# Patient Record
Sex: Female | Born: 1995
Health system: Southern US, Community
[De-identification: ages and names within clinical notes are randomized; demographics above are authoritative.]

## PROBLEM LIST (undated history)

## (undated) DIAGNOSIS — F32A Depression, unspecified: Secondary | ICD-10-CM

## (undated) DIAGNOSIS — F419 Anxiety disorder, unspecified: Secondary | ICD-10-CM

## (undated) DIAGNOSIS — F319 Bipolar disorder, unspecified: Secondary | ICD-10-CM

## (undated) DIAGNOSIS — K219 Gastro-esophageal reflux disease without esophagitis: Secondary | ICD-10-CM

## (undated) DIAGNOSIS — F329 Major depressive disorder, single episode, unspecified: Secondary | ICD-10-CM

## (undated) DIAGNOSIS — T7840XA Allergy, unspecified, initial encounter: Secondary | ICD-10-CM

## (undated) DIAGNOSIS — D241 Benign neoplasm of right breast: Secondary | ICD-10-CM

## (undated) DIAGNOSIS — K589 Irritable bowel syndrome without diarrhea: Secondary | ICD-10-CM

## (undated) HISTORY — DX: Anxiety disorder, unspecified: F41.9

## (undated) HISTORY — DX: Major depressive disorder, single episode, unspecified: F32.9

## (undated) HISTORY — DX: Irritable bowel syndrome, unspecified: K58.9

## (undated) HISTORY — DX: Bipolar disorder, unspecified: F31.9

## (undated) HISTORY — DX: Gastro-esophageal reflux disease without esophagitis: K21.9

## (undated) HISTORY — PX: TONSILLECTOMY: SUR1361

## (undated) HISTORY — DX: Depression, unspecified: F32.A

## (undated) HISTORY — DX: Allergy, unspecified, initial encounter: T78.40XA

## (undated) HISTORY — DX: Benign neoplasm of right breast: D24.1

---
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
## 2014-09-13 ENCOUNTER — Other Ambulatory Visit: Payer: Self-pay | Admitting: Family Medicine

## 2014-09-13 DIAGNOSIS — R1084 Generalized abdominal pain: Secondary | ICD-10-CM

## 2014-09-13 DIAGNOSIS — N83209 Unspecified ovarian cyst, unspecified side: Secondary | ICD-10-CM

## 2014-09-14 ENCOUNTER — Encounter: Payer: Self-pay | Admitting: Obstetrics & Gynecology

## 2014-09-14 ENCOUNTER — Ambulatory Visit (INDEPENDENT_AMBULATORY_CARE_PROVIDER_SITE_OTHER): Payer: 59 | Admitting: Obstetrics & Gynecology

## 2014-09-14 VITALS — BP 136/87 | HR 62 | Wt 171.0 lb

## 2014-09-14 DIAGNOSIS — N83209 Unspecified ovarian cyst, unspecified side: Secondary | ICD-10-CM | POA: Insufficient documentation

## 2014-09-14 DIAGNOSIS — N8320 Unspecified ovarian cysts: Secondary | ICD-10-CM | POA: Diagnosis not present

## 2014-09-14 DIAGNOSIS — R1084 Generalized abdominal pain: Secondary | ICD-10-CM | POA: Diagnosis not present

## 2014-09-14 DIAGNOSIS — N83202 Unspecified ovarian cyst, left side: Secondary | ICD-10-CM

## 2014-09-14 MED ORDER — NORETHINDRONE ACET-ETHINYL EST 1.5-30 MG-MCG PO TABS: 1.0000 | ORAL_TABLET | Freq: Every day | ORAL | Status: DC

## 2014-09-14 NOTE — Patient Instructions (Signed)
Ovarian Cyst An ovarian cyst is a fluid-filled sac that forms on an ovary. The ovaries are small organs that produce eggs in women. Various types of cysts can form on the ovaries. Most are not cancerous. Many do not cause problems, and they often go away on their own. Some may cause symptoms and require treatment. Common types of ovarian cysts include:  Functional cysts--These cysts may occur every month during the menstrual cycle. This is normal. The cysts usually go away with the next menstrual cycle if the woman does not get pregnant. Usually, there are no symptoms with a functional cyst.  Endometrioma cysts--These cysts form from the tissue that lines the uterus. They are also called "chocolate cysts" because they become filled with blood that turns brown. This type of cyst can cause pain in the lower abdomen during intercourse and with your menstrual period.  Cystadenoma cysts--This type develops from the cells on the outside of the ovary. These cysts can get very big and cause lower abdomen pain and pain with intercourse. This type of cyst can twist on itself, cut off its blood supply, and cause severe pain. It can also easily rupture and cause a lot of pain.  Dermoid cysts--This type of cyst is sometimes found in both ovaries. These cysts may contain different kinds of body tissue, such as skin, teeth, hair, or cartilage. They usually do not cause symptoms unless they get very big.  Theca lutein cysts--These cysts occur when too much of a certain hormone (human chorionic gonadotropin) is produced and overstimulates the ovaries to produce an egg. This is most common after procedures used to assist with the conception of a baby (in vitro fertilization). CAUSES   Fertility drugs can cause a condition in which multiple large cysts are formed on the ovaries. This is called ovarian hyperstimulation syndrome.  A condition called polycystic ovary syndrome can cause hormonal imbalances that can lead to  nonfunctional ovarian cysts. SIGNS AND SYMPTOMS  Many ovarian cysts do not cause symptoms. If symptoms are present, they may include:  Pelvic pain or pressure.  Pain in the lower abdomen.  Pain during sexual intercourse.  Increasing girth (swelling) of the abdomen.  Abnormal menstrual periods.  Increasing pain with menstrual periods.  Stopping having menstrual periods without being pregnant. DIAGNOSIS  These cysts are commonly found during a routine or annual pelvic exam. Tests may be ordered to find out more about the cyst. These tests may include:  Ultrasound.  X-ray of the pelvis.  CT scan.  MRI.  Blood tests. TREATMENT  Many ovarian cysts go away on their own without treatment. Your health care provider may want to check your cyst regularly for 2-3 months to see if it changes. For women in menopause, it is particularly important to monitor a cyst closely because of the higher rate of ovarian cancer in menopausal women. When treatment is needed, it may include any of the following:  A procedure to drain the cyst (aspiration). This may be done using a long needle and ultrasound. It can also be done through a laparoscopic procedure. This involves using a thin, lighted tube with a tiny camera on the end (laparoscope) inserted through a small incision.  Surgery to remove the whole cyst. This may be done using laparoscopic surgery or an open surgery involving a larger incision in the lower abdomen.  Hormone treatment or birth control pills. These methods are sometimes used to help dissolve a cyst. HOME CARE INSTRUCTIONS   Only take over-the-counter   or prescription medicines as directed by your health care provider.  Follow up with your health care provider as directed.  Get regular pelvic exams and Pap tests. SEEK MEDICAL CARE IF:   Your periods are late, irregular, or painful, or they stop.  Your pelvic pain or abdominal pain does not go away.  Your abdomen becomes  larger or swollen.  You have pressure on your bladder or trouble emptying your bladder completely.  You have pain during sexual intercourse.  You have feelings of fullness, pressure, or discomfort in your stomach.  You lose weight for no apparent reason.  You feel generally ill.  You become constipated.  You lose your appetite.  You develop acne.  You have an increase in body and facial hair.  You are gaining weight, without changing your exercise and eating habits.  You think you are pregnant. SEEK IMMEDIATE MEDICAL CARE IF:   You have increasing abdominal pain.  You feel sick to your stomach (nauseous), and you throw up (vomit).  You develop a fever that comes on suddenly.  You have abdominal pain during a bowel movement.  Your menstrual periods become heavier than usual. MAKE SURE YOU:  Understand these instructions.  Will watch your condition.  Will get help right away if you are not doing well or get worse. Document Released: 04/22/2005 Document Revised: 04/27/2013 Document Reviewed: 12/28/2012 Exit[employer] Patient Information 2015 [employer]. This information is not intended to replace advice given to you by your health care provider. Make sure you discuss any questions you have with your health care provider.  

## 2014-09-14 NOTE — Progress Notes (Signed)
   CLINIC ENCOUNTER NOTE  History:  19 y.o. G0P0000 here today for evaluation of left sided pelvic pain for 3 months.  Had an ultrasound in [place] in 3/20016 which showed a physiologic left ovarian cyst.  Reports pain has been episodic since then, and worsened over the past week.  Pain is moderate to severe in intensity and located in left pelvic area/lower abdomen, occasionally radiates to left hip area.  Helped a little with Ibuprofen.  Episodes last a few minutes - hours.  No associated fevers, nausea, vomiting, urinary or gastrointestinal symptoms.  Of note, patient was seen on 09/13/14 by her PCP at [facility] (Dr. Maurice Small, MD) who ordered a pelvic ultrasound for evaluation of this pain, this study has not been scheduled.  Past Medical History  Diagnosis Date  . Anxiety     Past Surgical History  Procedure Laterality Date  . Leg surgery Left 2014    The following portions of the patient's history were reviewed and updated as appropriate: allergies, current medications, past family history, past medical history, past social history, past surgical history and problem list.   Health Maintenance:  Received Gardasil series. On OCPs (Loestrin 1/20 for contraception).  Review of Systems:  Pertinent items are noted in HPI. Comprehensive review of systems was otherwise negative.  Objective:  Physical Exam BP 136/87 mmHg  Pulse 62  Wt 171 lb (77.565 kg)  LMP 09/01/2014 CONSTITUTIONAL: Well-developed, well-nourished female in no acute distress.  HENT:  Normocephalic, atraumatic, External right and left ear normal. Oropharynx is clear and moist EYES: Conjunctivae and EOM are normal. Pupils are equal, round, and reactive to light. No scleral icterus.  NECK: Normal range of motion, supple, no masses SKIN: Skin is warm and dry. No rash noted. Not diaphoretic. No erythema. No pallor. [place]: Alert and oriented to person, place, and time. Normal reflexes, muscle tone coordination. No  cranial nerve deficit noted. PSYCHIATRIC: Normal mood and affect. Normal behavior. Normal judgment and thought content. CARDIOVASCULAR: Normal heart rate noted, regular rhythm RESPIRATORY: Effort and breath sounds normal, no problems with respiration noted ABDOMEN: Soft, no distention noted.  Mild LLQ tenderness, no rebound or guarding.  PELVIC: Normal appearing external genitalia; normal appearing vaginal mucosa and cervix.  Normal appearing discharge.  Normal uterine size, no other palpable masses, no uterine tenderness. Left adnexal fullness and tenderness appreciated, normal right adnexa. MUSCULOSKELETAL: Normal range of motion. No edema and no tenderness.   Assessment & Plan:  Pelvic ultrasound scheduled; will follow up results and manage accordingly. Discussed OCPs and its role in ovarian cyst suppression. Changed Loestrin from 1/20 formulation to 1.5/30 formulation (this was prescribed for patient) to help with ovulation suppression, will monitor response. Patient will call for any worsening symptoms; pain/torsion precautions reviewed Routine preventative health maintenance measures emphasized.   Verita Schneiders, MD, [facility] [place] for [employer], [place]

## 2014-09-15 ENCOUNTER — Ambulatory Visit
Admission: RE | Admit: 2014-09-15 | Discharge: 2014-09-15 | Disposition: A | Discharge status: Home / Self Care | Payer: 59 | Source: Ambulatory Visit | Attending: Family Medicine | Admitting: Family Medicine

## 2014-09-22 ENCOUNTER — Telehealth: Payer: Self-pay | Admitting: *Deleted

## 2014-09-22 NOTE — Telephone Encounter (Signed)
Pain can be from GI, musculoskeletal or other etiology.  Can be seen by PCP for further evaluation.  No GYN etiology seen at this point.

## 2014-09-22 NOTE — Telephone Encounter (Signed)
Patient called for test results.  Advised her of ultrasound results.  She is continuing to have pain and has had pain since February.  Some days the pain is much worse than others but it is constantly there.  She wants to know what her options are to figure out what could be causing her discomfort.

## 2014-09-29 NOTE — Telephone Encounter (Signed)
Advised patient of recommendations.  

## 2014-09-30 ENCOUNTER — Encounter (HOSPITAL_COMMUNITY): Payer: Self-pay | Admitting: *Deleted

## 2014-09-30 ENCOUNTER — Emergency Department (HOSPITAL_COMMUNITY)
Admission: EM | Admit: 2014-09-30 | Discharge: 2014-09-30 | Disposition: A | Discharge status: Home / Self Care | Payer: 59 | Source: Home / Self Care

## 2014-09-30 DIAGNOSIS — L723 Sebaceous cyst: Secondary | ICD-10-CM

## 2014-09-30 MED ORDER — NEOMYCIN-POLYMYXIN-HC 3.5-10000-1 OT SUSP: 4.0000 [drp] | Freq: Three times a day (TID) | OTIC | Status: DC

## 2014-09-30 MED ORDER — CEPHALEXIN 500 MG PO CAPS
500.0000 mg | ORAL_CAPSULE | Freq: Four times a day (QID) | ORAL | Status: DC
Start: 2014-09-30 — End: 2018-03-10

## 2014-09-30 NOTE — ED Provider Notes (Signed)
CSN: [id number]     Arrival date & time 09/30/14  1120 History   None    Chief Complaint  Patient presents with  . Ear Problem   (Consider location/radiation/quality/duration/timing/severity/associated sxs/prior Treatment) Patient is a 19 y.o. female presenting with ear pain. The history is provided by the patient.  Otalgia Location:  Right Behind ear:  No abnormality Quality:  Dull Severity:  Mild Onset quality:  Gradual Duration:  4 days Progression:  Worsening Chronicity:  New Context comment:  H/o cysts x 2 in left canal , feels similar now on right side. Relieved by:  None tried Worsened by:  Nothing tried Associated symptoms: no ear discharge     Past Medical History  Diagnosis Date  . Anxiety    Past Surgical History  Procedure Laterality Date  . Leg surgery Left 2014   Family History  Problem Relation Age of Onset  . Diabetes Maternal Grandmother    History  Substance Use Topics  . Smoking status: Never Smoker   . Smokeless tobacco: Never Used  . Alcohol Use: No   OB History    Gravida Para Term Preterm AB TAB SAB Ectopic Multiple Living   0 0 0 0 0 0 0 0 0 0      Review of Systems  Constitutional: Negative.   HENT: Positive for ear pain. Negative for ear discharge.     Allergies  Review of patient's allergies indicates no known allergies.  Home Medications   Prior to Admission medications   Medication Sig Start Date End Date Taking? Authorizing Provider  busPIRone (BUSPAR) 10 MG tablet Take 10 mg by mouth 3 (three) times daily.    Historical Provider, MD  cephALEXin (KEFLEX) 500 MG capsule Take 1 capsule (500 mg total) by mouth 4 (four) times daily. Take all of medicine and drink lots of fluids 09/30/14   Billy Fischer, MD  neomycin-polymyxin-hydrocortisone (CORTISPORIN) 3.5-10000-1 otic suspension Place 4 drops into the right ear 3 (three) times daily. 09/30/14   Billy Fischer, MD  Norethindrone Acetate-Ethinyl Estradiol  (JUNEL,LOESTRIN,MICROGESTIN) 1.5-30 MG-MCG tablet Take 1 tablet by mouth daily. 09/14/14   Sallyanne Havers Anyanwu, MD   BP 125/78 mmHg  Pulse 56  Temp(Src) 98.2 F (36.8 C) (Oral)  Resp 18  SpO2 100%  LMP 09/19/2014 Physical Exam  Constitutional: She is oriented to person, place, and time. She appears well-developed and well-nourished. No distress.  HENT:  Head: Normocephalic.  Right Ear: There is swelling and tenderness. No drainage. No decreased hearing is noted.  Left Ear: External ear normal.  Ears:  Mouth/Throat: Oropharynx is clear and moist.  Eyes: Conjunctivae are normal. Pupils are equal, round, and reactive to light.  Neck: Normal range of motion. Neck supple.  Lymphadenopathy:    She has no cervical adenopathy.  Neurological: She is alert and oriented to person, place, and time.  Skin: Skin is warm and dry.  Nursing note and vitals reviewed.   ED Course  Procedures (including critical care time) Labs Review Labs Reviewed - No data to display  Imaging Review No results found.   MDM   1. Sebaceous cyst of ear        Billy Fischer, MD 09/30/14 [phone]

## 2014-09-30 NOTE — ED Notes (Signed)
Pt  Reports  A  Boil  Inside       r   Ear   X  3  Days          Pt  Reports  Has  Had  Similar episode  l  Ear  In  Past      she  denys  Any other  Symptoms

## 2014-09-30 NOTE — Discharge Instructions (Signed)
Warm soak before drops, see dr Constance Holster if further problems.

## 2014-11-23 ENCOUNTER — Encounter: Payer: Self-pay | Admitting: *Deleted

## 2015-05-09 DIAGNOSIS — G43B0 Ophthalmoplegic migraine, not intractable: Secondary | ICD-10-CM | POA: Diagnosis not present

## 2015-05-17 DIAGNOSIS — F411 Generalized anxiety disorder: Secondary | ICD-10-CM | POA: Diagnosis not present

## 2015-06-05 DIAGNOSIS — J039 Acute tonsillitis, unspecified: Secondary | ICD-10-CM | POA: Diagnosis not present

## 2015-06-12 DIAGNOSIS — H6121 Impacted cerumen, right ear: Secondary | ICD-10-CM | POA: Diagnosis not present

## 2015-06-21 DIAGNOSIS — F411 Generalized anxiety disorder: Secondary | ICD-10-CM | POA: Diagnosis not present

## 2015-09-12 DIAGNOSIS — L299 Pruritus, unspecified: Secondary | ICD-10-CM | POA: Diagnosis not present

## 2015-09-12 DIAGNOSIS — H938X1 Other specified disorders of right ear: Secondary | ICD-10-CM | POA: Diagnosis not present

## 2015-09-14 DIAGNOSIS — M25562 Pain in left knee: Secondary | ICD-10-CM | POA: Diagnosis not present

## 2015-09-14 DIAGNOSIS — M25561 Pain in right knee: Secondary | ICD-10-CM | POA: Diagnosis not present

## 2015-09-19 IMAGING — US US PELVIS COMPLETE
1 series · 14 of 25 positions shown · non-contrast
Comparison: None

CLINICAL DATA: Left lower quadrant pain since [REDACTED]



[Series 1: us pelvis complete · 0.30mm/px · 14 of 55 slices shown]
[im 1/55]
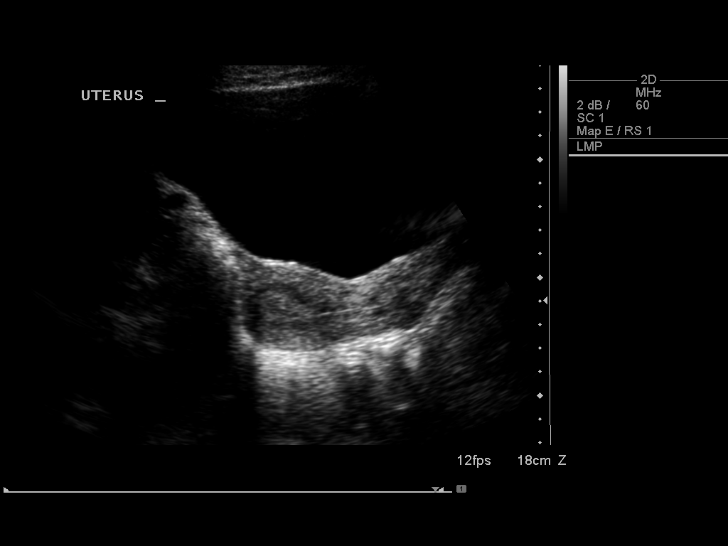
[im 5/55]
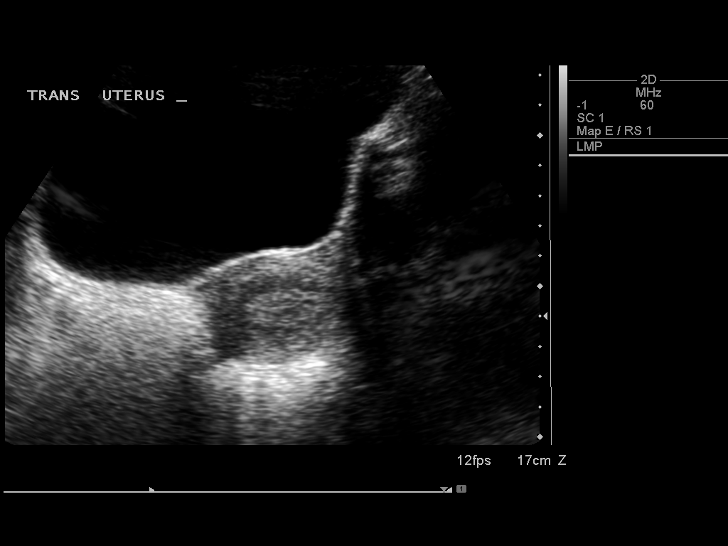
[im 10/55]
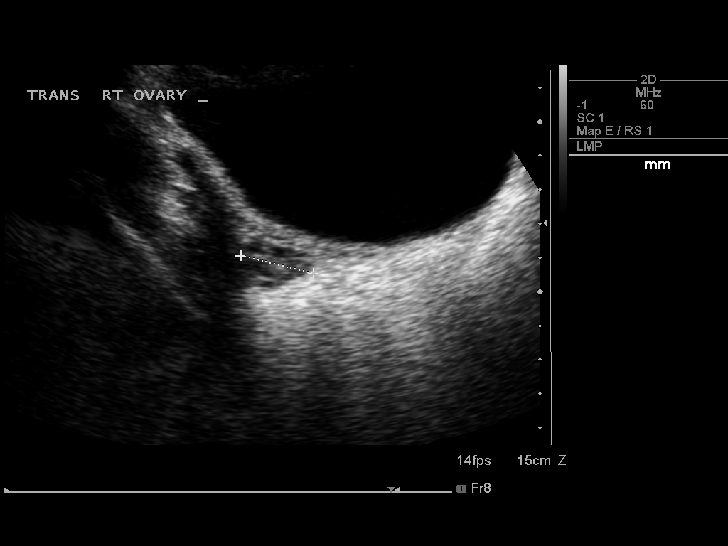
[im 14/55]
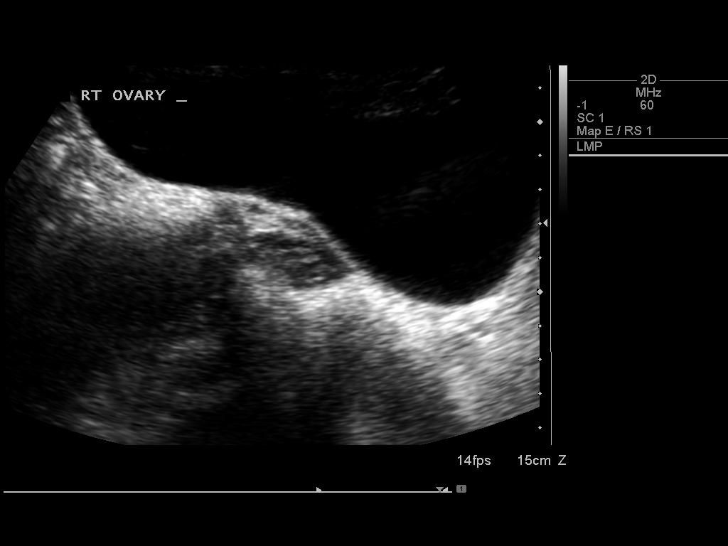
[im 19/55]
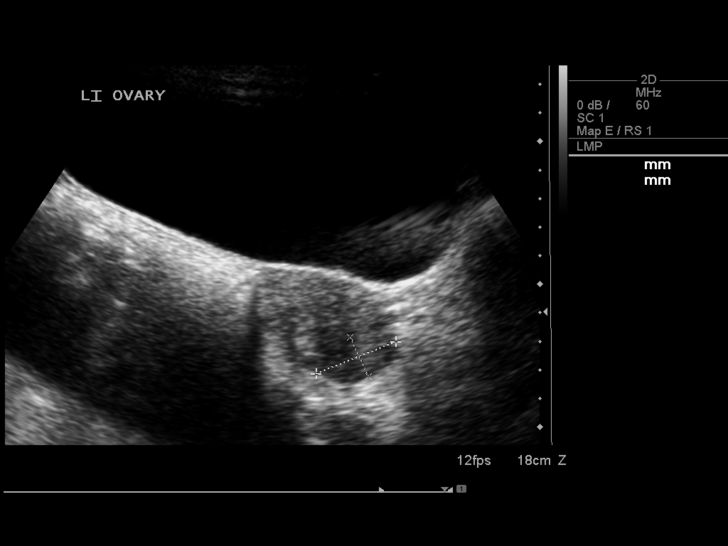
[im 21/55]
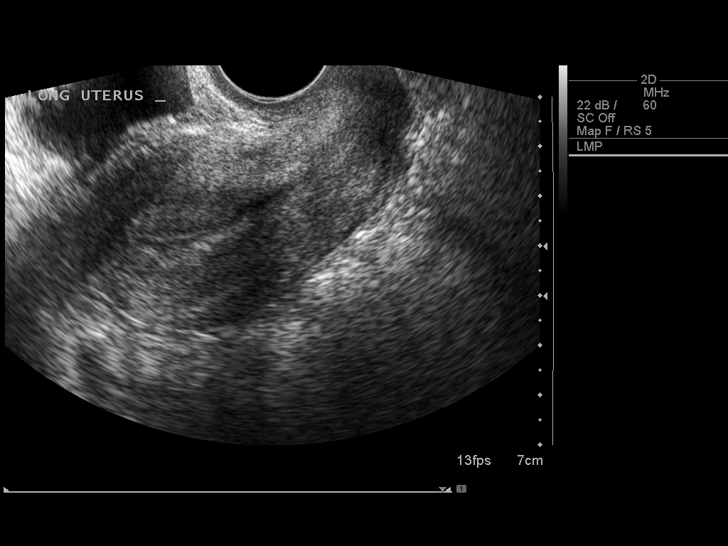
[im 25/55]
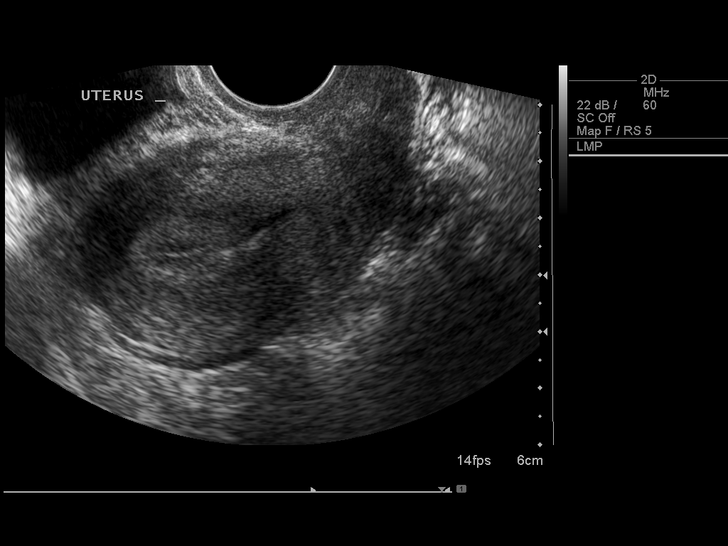
[im 30/55]
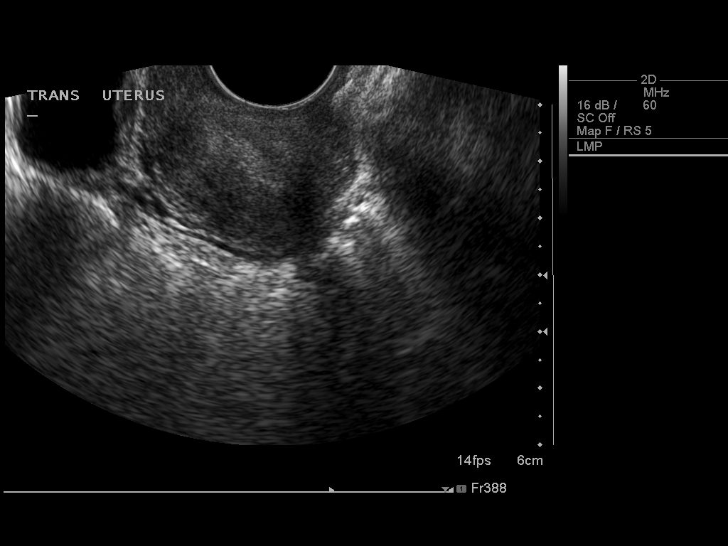
[im 34/55]
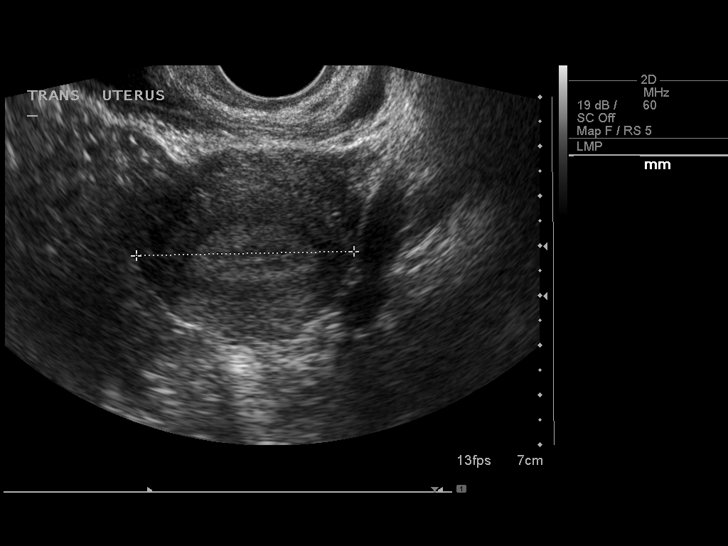
[im 37/55]
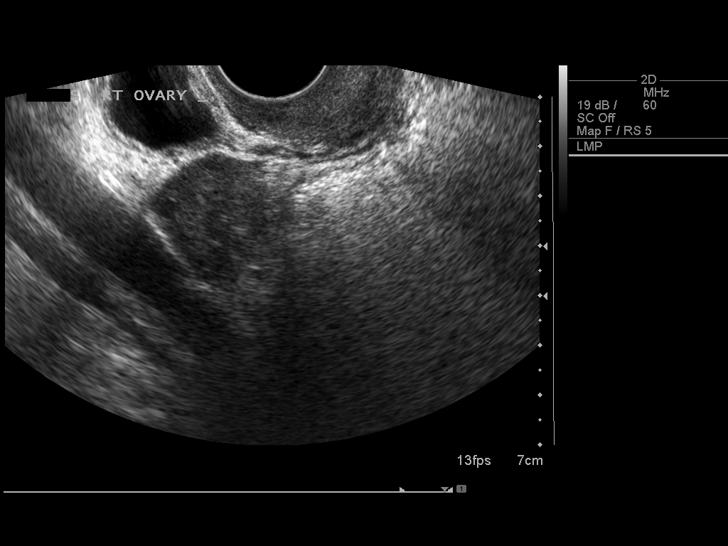
[im 41/55]
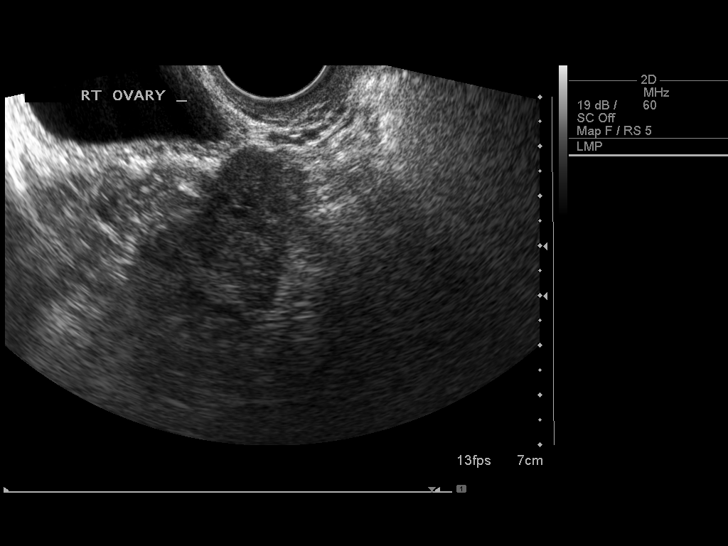
[im 46/55]
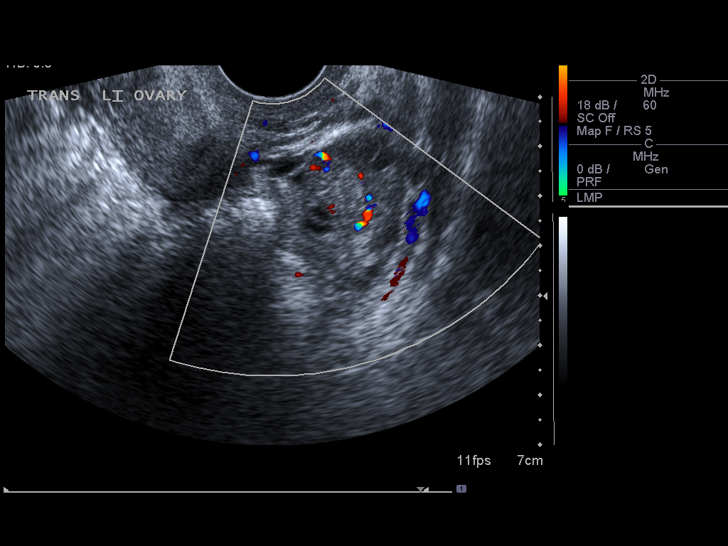
[im 50/55]
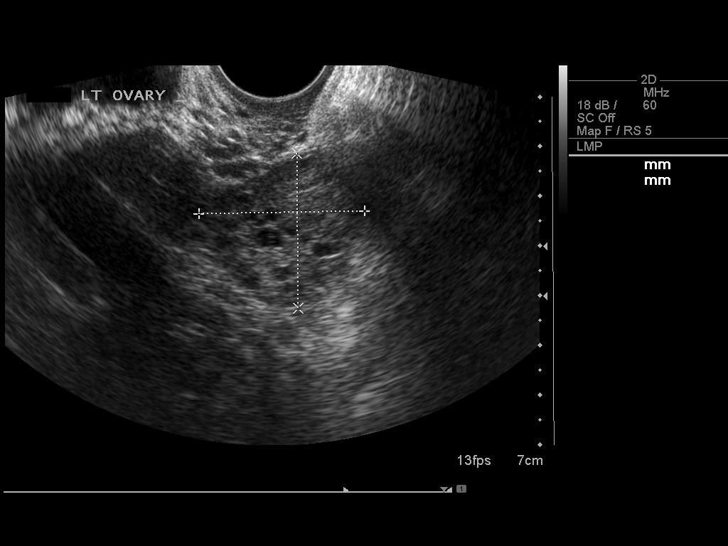
[im 55/55]
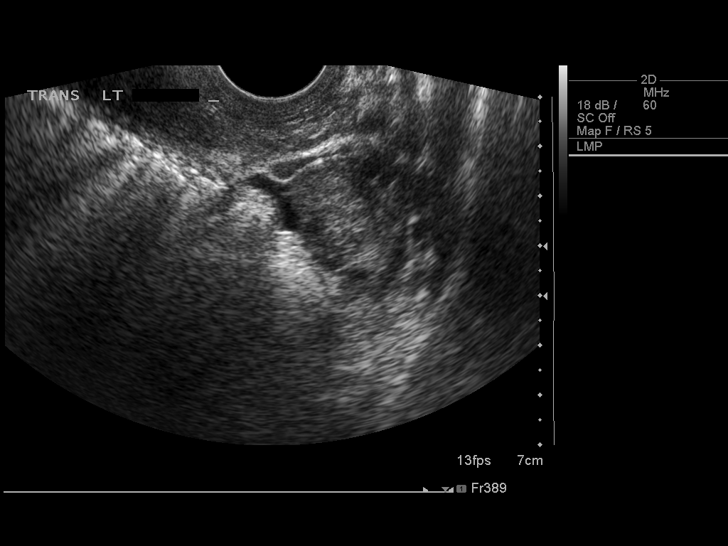

[14 of 25 positions shown; findings below may reference images not displayed]

FINDINGS: Uterus

Measurements: 8.2 x 3.6 x 4.6 cm. No fibroids or other mass
visualized.

Endometrium

Thickness: 13 mm.  No focal abnormality visualized.

Right ovary

Measurements: 2.1 x 3.2 x 2.5 cm. Normal appearance/no adnexal mass.

Left ovary

Measurements: 3.3 x 3.1 x 2.2 cm. Normal appearance/no adnexal mass.

Other findings

Trace left adnexal fluid.
IMPRESSION: Negative pelvic ultrasound.

## 2015-09-21 DIAGNOSIS — H52223 Regular astigmatism, bilateral: Secondary | ICD-10-CM | POA: Diagnosis not present

## 2015-09-21 DIAGNOSIS — H5213 Myopia, bilateral: Secondary | ICD-10-CM | POA: Diagnosis not present

## 2015-11-27 DIAGNOSIS — L700 Acne vulgaris: Secondary | ICD-10-CM | POA: Diagnosis not present

## 2015-11-28 DIAGNOSIS — Z0000 Encounter for general adult medical examination without abnormal findings: Secondary | ICD-10-CM | POA: Diagnosis not present

## 2015-11-28 DIAGNOSIS — F411 Generalized anxiety disorder: Secondary | ICD-10-CM | POA: Diagnosis not present

## 2015-12-05 ENCOUNTER — Other Ambulatory Visit (HOSPITAL_COMMUNITY)
Admission: RE | Admit: 2015-12-05 | Discharge: 2015-12-05 | Disposition: A | Discharge status: Home / Self Care | Payer: 59 | Source: Ambulatory Visit | Attending: Family Medicine | Admitting: Family Medicine

## 2015-12-05 ENCOUNTER — Other Ambulatory Visit: Payer: Self-pay | Admitting: Family Medicine

## 2015-12-05 DIAGNOSIS — Z124 Encounter for screening for malignant neoplasm of cervix: Secondary | ICD-10-CM | POA: Diagnosis not present

## 2015-12-05 DIAGNOSIS — Z113 Encounter for screening for infections with a predominantly sexual mode of transmission: Secondary | ICD-10-CM | POA: Diagnosis not present

## 2015-12-05 DIAGNOSIS — Z01411 Encounter for gynecological examination (general) (routine) with abnormal findings: Secondary | ICD-10-CM | POA: Insufficient documentation

## 2015-12-06 LAB — CYTOLOGY - PAP

## 2016-05-05 ENCOUNTER — Telehealth: Payer: 59 | Admitting: Nurse Practitioner

## 2016-05-05 DIAGNOSIS — J358 Other chronic diseases of tonsils and adenoids: Secondary | ICD-10-CM

## 2016-05-05 NOTE — Progress Notes (Signed)
The best thing you can do for tonsil stones is get a water pick and use it in the back of your throat. There is noting you can do to stop them and they can really be a nuisance. Sorry I could not do more to help you.

## 2016-05-07 DIAGNOSIS — L700 Acne vulgaris: Secondary | ICD-10-CM | POA: Diagnosis not present

## 2016-05-10 DIAGNOSIS — K219 Gastro-esophageal reflux disease without esophagitis: Secondary | ICD-10-CM | POA: Diagnosis not present

## 2016-05-14 DIAGNOSIS — Z6823 Body mass index (BMI) 23.0-23.9, adult: Secondary | ICD-10-CM | POA: Diagnosis not present

## 2016-05-14 DIAGNOSIS — J3501 Chronic tonsillitis: Secondary | ICD-10-CM | POA: Diagnosis not present

## 2016-05-14 DIAGNOSIS — J353 Hypertrophy of tonsils with hypertrophy of adenoids: Secondary | ICD-10-CM | POA: Diagnosis not present

## 2016-05-24 DIAGNOSIS — F331 Major depressive disorder, recurrent, moderate: Secondary | ICD-10-CM | POA: Diagnosis not present

## 2016-06-01 DIAGNOSIS — F331 Major depressive disorder, recurrent, moderate: Secondary | ICD-10-CM | POA: Diagnosis not present

## 2016-06-10 DIAGNOSIS — F419 Anxiety disorder, unspecified: Secondary | ICD-10-CM | POA: Diagnosis not present

## 2016-06-10 DIAGNOSIS — F338 Other recurrent depressive disorders: Secondary | ICD-10-CM | POA: Diagnosis not present

## 2016-06-10 DIAGNOSIS — J351 Hypertrophy of tonsils: Secondary | ICD-10-CM | POA: Diagnosis not present

## 2016-06-10 DIAGNOSIS — J353 Hypertrophy of tonsils with hypertrophy of adenoids: Secondary | ICD-10-CM | POA: Diagnosis not present

## 2016-06-10 DIAGNOSIS — J3503 Chronic tonsillitis and adenoiditis: Secondary | ICD-10-CM | POA: Diagnosis not present

## 2016-06-10 DIAGNOSIS — K219 Gastro-esophageal reflux disease without esophagitis: Secondary | ICD-10-CM | POA: Diagnosis not present

## 2016-06-10 DIAGNOSIS — Z809 Family history of malignant neoplasm, unspecified: Secondary | ICD-10-CM | POA: Diagnosis not present

## 2016-06-21 DIAGNOSIS — F331 Major depressive disorder, recurrent, moderate: Secondary | ICD-10-CM | POA: Diagnosis not present

## 2016-07-12 DIAGNOSIS — F331 Major depressive disorder, recurrent, moderate: Secondary | ICD-10-CM | POA: Diagnosis not present

## 2016-07-19 DIAGNOSIS — F331 Major depressive disorder, recurrent, moderate: Secondary | ICD-10-CM | POA: Diagnosis not present

## 2016-08-02 DIAGNOSIS — F331 Major depressive disorder, recurrent, moderate: Secondary | ICD-10-CM | POA: Diagnosis not present

## 2016-08-08 DIAGNOSIS — F331 Major depressive disorder, recurrent, moderate: Secondary | ICD-10-CM | POA: Diagnosis not present

## 2016-08-14 DIAGNOSIS — F331 Major depressive disorder, recurrent, moderate: Secondary | ICD-10-CM | POA: Diagnosis not present

## 2016-08-21 DIAGNOSIS — F331 Major depressive disorder, recurrent, moderate: Secondary | ICD-10-CM | POA: Diagnosis not present

## 2016-09-04 DIAGNOSIS — F331 Major depressive disorder, recurrent, moderate: Secondary | ICD-10-CM | POA: Diagnosis not present

## 2016-09-11 DIAGNOSIS — F331 Major depressive disorder, recurrent, moderate: Secondary | ICD-10-CM | POA: Diagnosis not present

## 2016-09-25 DIAGNOSIS — N63 Unspecified lump in unspecified breast: Secondary | ICD-10-CM | POA: Diagnosis not present

## 2016-09-26 ENCOUNTER — Other Ambulatory Visit: Payer: Self-pay | Admitting: Family Medicine

## 2016-09-26 DIAGNOSIS — N63 Unspecified lump in unspecified breast: Secondary | ICD-10-CM

## 2016-10-01 DIAGNOSIS — H5213 Myopia, bilateral: Secondary | ICD-10-CM | POA: Diagnosis not present

## 2016-10-01 DIAGNOSIS — H608X3 Other otitis externa, bilateral: Secondary | ICD-10-CM | POA: Diagnosis not present

## 2016-10-02 ENCOUNTER — Other Ambulatory Visit: Payer: Self-pay | Admitting: Family Medicine

## 2016-10-02 ENCOUNTER — Ambulatory Visit
Admission: RE | Admit: 2016-10-02 | Discharge: 2016-10-02 | Disposition: A | Discharge status: Home / Self Care | Payer: 59 | Source: Ambulatory Visit | Attending: Family Medicine | Admitting: Family Medicine

## 2016-10-02 DIAGNOSIS — N6310 Unspecified lump in the right breast, unspecified quadrant: Secondary | ICD-10-CM

## 2016-10-02 DIAGNOSIS — N63 Unspecified lump in unspecified breast: Secondary | ICD-10-CM

## 2016-12-09 DIAGNOSIS — Z0000 Encounter for general adult medical examination without abnormal findings: Secondary | ICD-10-CM | POA: Diagnosis not present

## 2016-12-09 DIAGNOSIS — G43909 Migraine, unspecified, not intractable, without status migrainosus: Secondary | ICD-10-CM | POA: Diagnosis not present

## 2016-12-09 DIAGNOSIS — Z23 Encounter for immunization: Secondary | ICD-10-CM | POA: Diagnosis not present

## 2016-12-09 DIAGNOSIS — F900 Attention-deficit hyperactivity disorder, predominantly inattentive type: Secondary | ICD-10-CM | POA: Diagnosis not present

## 2016-12-09 DIAGNOSIS — F330 Major depressive disorder, recurrent, mild: Secondary | ICD-10-CM | POA: Diagnosis not present

## 2016-12-09 DIAGNOSIS — E785 Hyperlipidemia, unspecified: Secondary | ICD-10-CM | POA: Diagnosis not present

## 2016-12-09 DIAGNOSIS — K219 Gastro-esophageal reflux disease without esophagitis: Secondary | ICD-10-CM | POA: Diagnosis not present

## 2016-12-09 DIAGNOSIS — F419 Anxiety disorder, unspecified: Secondary | ICD-10-CM | POA: Diagnosis not present

## 2017-02-27 DIAGNOSIS — R112 Nausea with vomiting, unspecified: Secondary | ICD-10-CM | POA: Diagnosis not present

## 2017-02-27 DIAGNOSIS — K219 Gastro-esophageal reflux disease without esophagitis: Secondary | ICD-10-CM | POA: Diagnosis not present

## 2017-02-27 DIAGNOSIS — F419 Anxiety disorder, unspecified: Secondary | ICD-10-CM | POA: Diagnosis not present

## 2017-02-27 DIAGNOSIS — K589 Irritable bowel syndrome without diarrhea: Secondary | ICD-10-CM | POA: Diagnosis not present

## 2017-02-27 DIAGNOSIS — F410 Panic disorder [episodic paroxysmal anxiety] without agoraphobia: Secondary | ICD-10-CM | POA: Diagnosis not present

## 2017-02-27 DIAGNOSIS — K529 Noninfective gastroenteritis and colitis, unspecified: Secondary | ICD-10-CM | POA: Diagnosis not present

## 2017-02-27 DIAGNOSIS — F329 Major depressive disorder, single episode, unspecified: Secondary | ICD-10-CM | POA: Diagnosis not present

## 2017-02-27 DIAGNOSIS — R1013 Epigastric pain: Secondary | ICD-10-CM | POA: Diagnosis not present

## 2017-02-28 DIAGNOSIS — F419 Anxiety disorder, unspecified: Secondary | ICD-10-CM | POA: Diagnosis not present

## 2017-02-28 DIAGNOSIS — F410 Panic disorder [episodic paroxysmal anxiety] without agoraphobia: Secondary | ICD-10-CM | POA: Diagnosis not present

## 2017-02-28 DIAGNOSIS — R112 Nausea with vomiting, unspecified: Secondary | ICD-10-CM | POA: Diagnosis not present

## 2017-02-28 DIAGNOSIS — K219 Gastro-esophageal reflux disease without esophagitis: Secondary | ICD-10-CM | POA: Diagnosis not present

## 2017-02-28 DIAGNOSIS — K529 Noninfective gastroenteritis and colitis, unspecified: Secondary | ICD-10-CM | POA: Diagnosis not present

## 2017-02-28 DIAGNOSIS — K589 Irritable bowel syndrome without diarrhea: Secondary | ICD-10-CM | POA: Diagnosis not present

## 2017-02-28 DIAGNOSIS — F329 Major depressive disorder, single episode, unspecified: Secondary | ICD-10-CM | POA: Diagnosis not present

## 2017-03-26 DIAGNOSIS — F331 Major depressive disorder, recurrent, moderate: Secondary | ICD-10-CM | POA: Diagnosis not present

## 2017-04-21 DIAGNOSIS — F331 Major depressive disorder, recurrent, moderate: Secondary | ICD-10-CM | POA: Diagnosis not present

## 2017-04-23 ENCOUNTER — Other Ambulatory Visit: Payer: 59

## 2017-04-30 ENCOUNTER — Other Ambulatory Visit: Payer: Self-pay | Admitting: Family Medicine

## 2017-04-30 ENCOUNTER — Ambulatory Visit
Admission: RE | Admit: 2017-04-30 | Discharge: 2017-04-30 | Disposition: A | Discharge status: Home / Self Care | Payer: 59 | Source: Ambulatory Visit | Attending: Family Medicine | Admitting: Family Medicine

## 2017-04-30 DIAGNOSIS — N6310 Unspecified lump in the right breast, unspecified quadrant: Secondary | ICD-10-CM

## 2017-04-30 DIAGNOSIS — N6489 Other specified disorders of breast: Secondary | ICD-10-CM | POA: Diagnosis not present

## 2017-05-12 DIAGNOSIS — Z6825 Body mass index (BMI) 25.0-25.9, adult: Secondary | ICD-10-CM | POA: Diagnosis not present

## 2017-05-12 DIAGNOSIS — L819 Disorder of pigmentation, unspecified: Secondary | ICD-10-CM | POA: Diagnosis not present

## 2017-05-12 DIAGNOSIS — L700 Acne vulgaris: Secondary | ICD-10-CM | POA: Diagnosis not present

## 2017-05-12 DIAGNOSIS — F331 Major depressive disorder, recurrent, moderate: Secondary | ICD-10-CM | POA: Diagnosis not present

## 2017-07-11 DIAGNOSIS — S93491A Sprain of other ligament of right ankle, initial encounter: Secondary | ICD-10-CM | POA: Diagnosis not present

## 2017-07-14 DIAGNOSIS — F331 Major depressive disorder, recurrent, moderate: Secondary | ICD-10-CM | POA: Diagnosis not present

## 2017-07-23 DIAGNOSIS — F331 Major depressive disorder, recurrent, moderate: Secondary | ICD-10-CM | POA: Diagnosis not present

## 2017-07-30 DIAGNOSIS — F331 Major depressive disorder, recurrent, moderate: Secondary | ICD-10-CM | POA: Diagnosis not present

## 2017-08-02 DIAGNOSIS — F331 Major depressive disorder, recurrent, moderate: Secondary | ICD-10-CM | POA: Diagnosis not present

## 2017-08-06 DIAGNOSIS — F331 Major depressive disorder, recurrent, moderate: Secondary | ICD-10-CM | POA: Diagnosis not present

## 2017-08-20 DIAGNOSIS — F331 Major depressive disorder, recurrent, moderate: Secondary | ICD-10-CM | POA: Diagnosis not present

## 2017-09-25 DIAGNOSIS — S93492A Sprain of other ligament of left ankle, initial encounter: Secondary | ICD-10-CM | POA: Diagnosis not present

## 2017-09-25 DIAGNOSIS — S93491A Sprain of other ligament of right ankle, initial encounter: Secondary | ICD-10-CM | POA: Diagnosis not present

## 2017-09-26 DIAGNOSIS — H5213 Myopia, bilateral: Secondary | ICD-10-CM | POA: Diagnosis not present

## 2017-09-26 DIAGNOSIS — F419 Anxiety disorder, unspecified: Secondary | ICD-10-CM | POA: Diagnosis not present

## 2017-09-26 DIAGNOSIS — H52223 Regular astigmatism, bilateral: Secondary | ICD-10-CM | POA: Diagnosis not present

## 2017-10-06 IMAGING — US ULTRASOUND RIGHT BREAST LIMITED
1 series · 6 of 6 positions shown · non-contrast
Comparison: None

CLINICAL DATA: 21-year-old female with palpable right breast mass
identified on self-examination.

EXAM:
ULTRASOUND OF THE RIGHT BREAST

[Series 1: ultrasound right breast limited · 0.05mm/px · 6 of 6 slices shown]
[im 1/6]
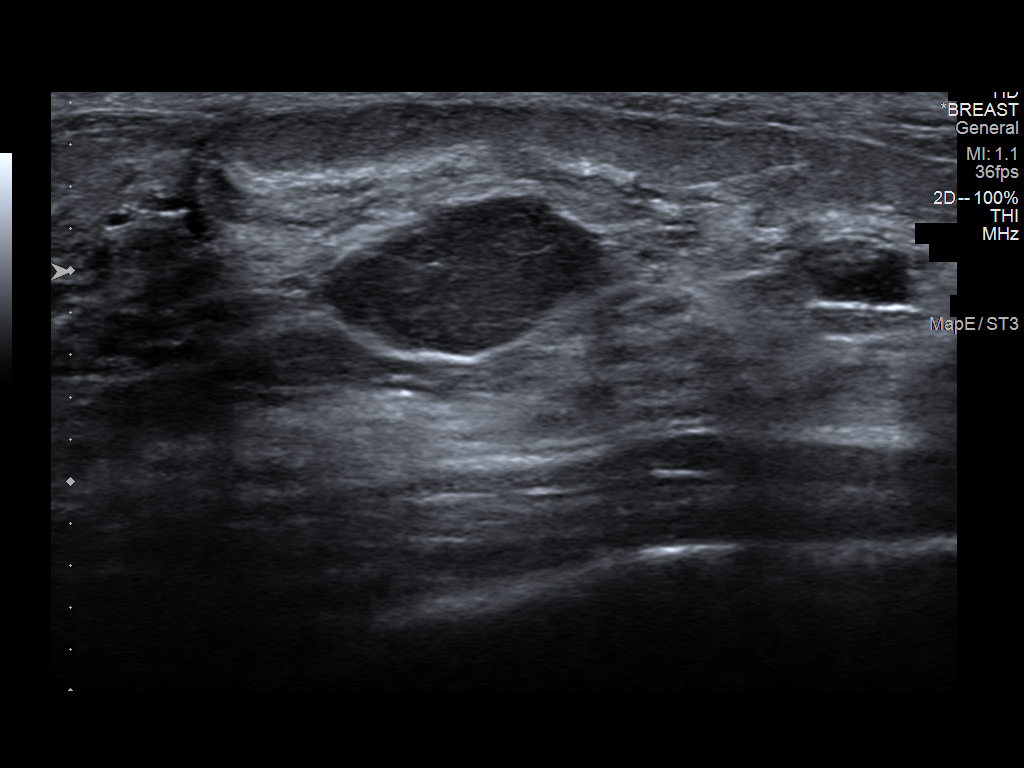
[im 2/6]
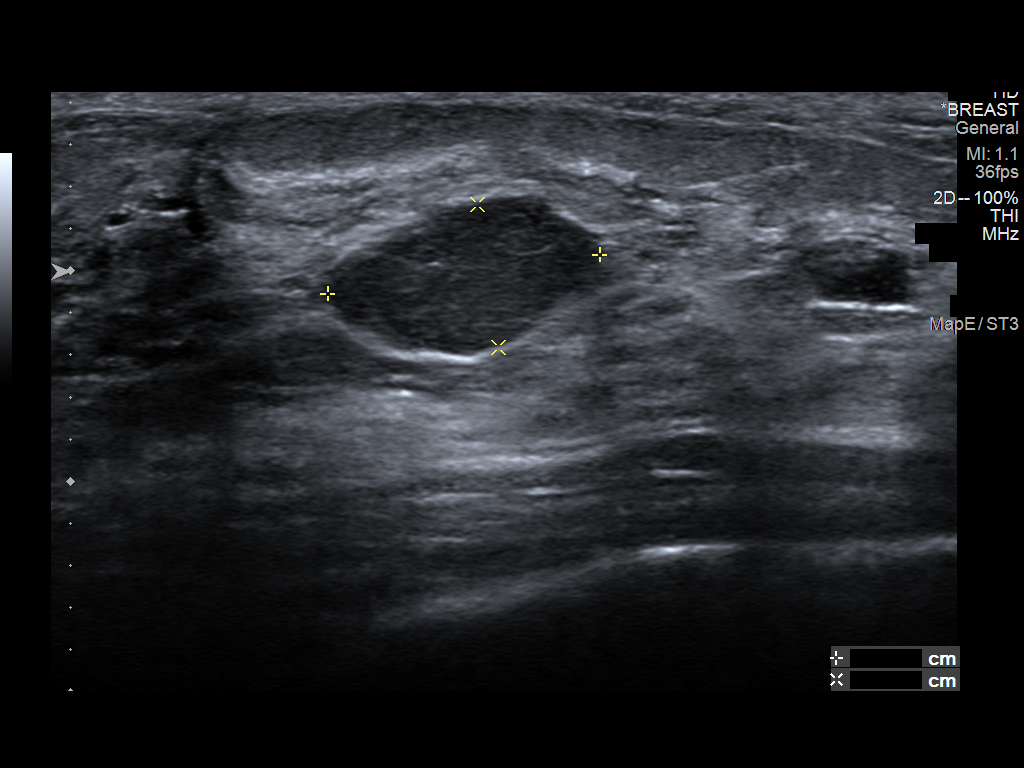
[im 3/6]
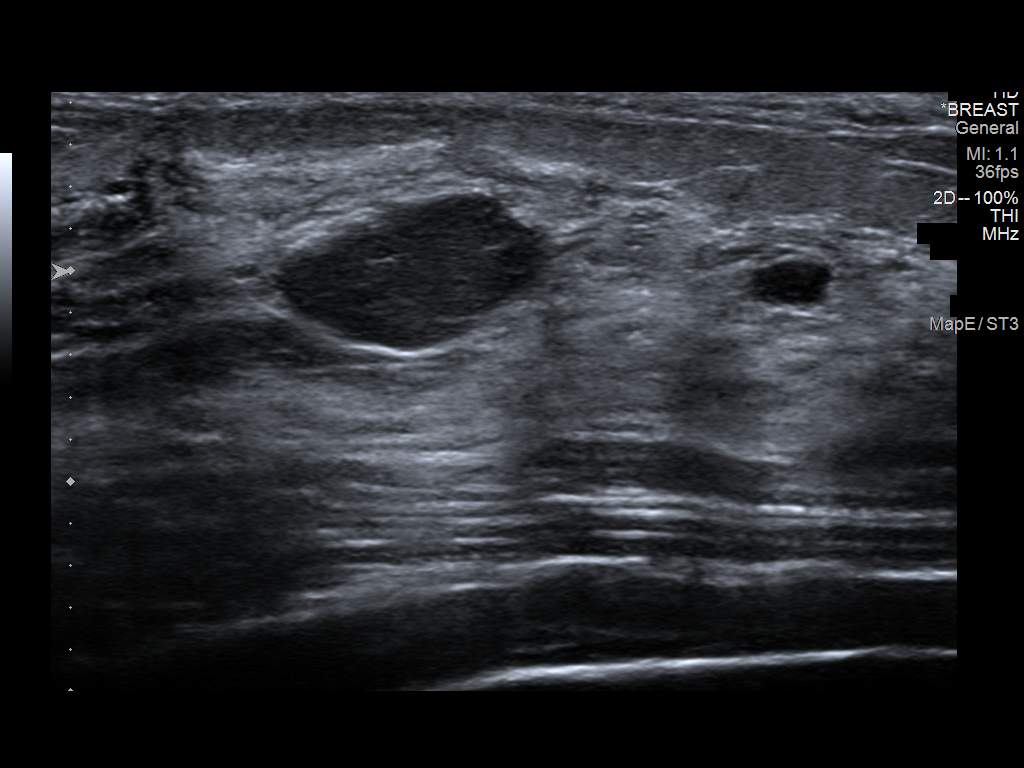
[im 4/6]
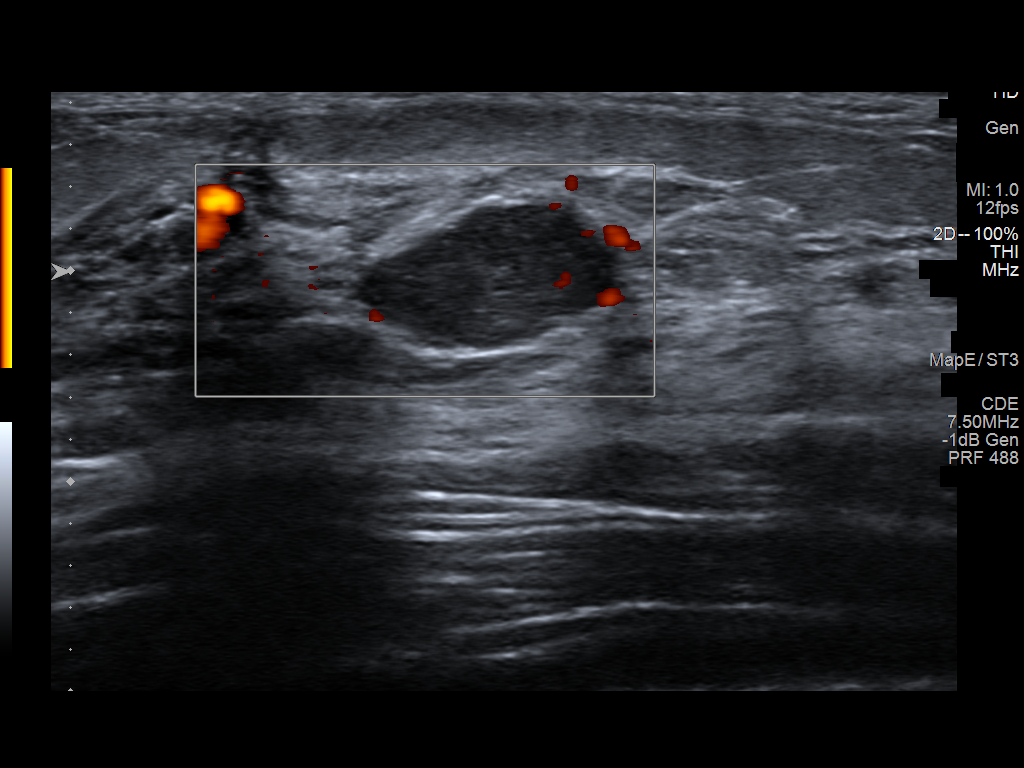
[im 5/6]
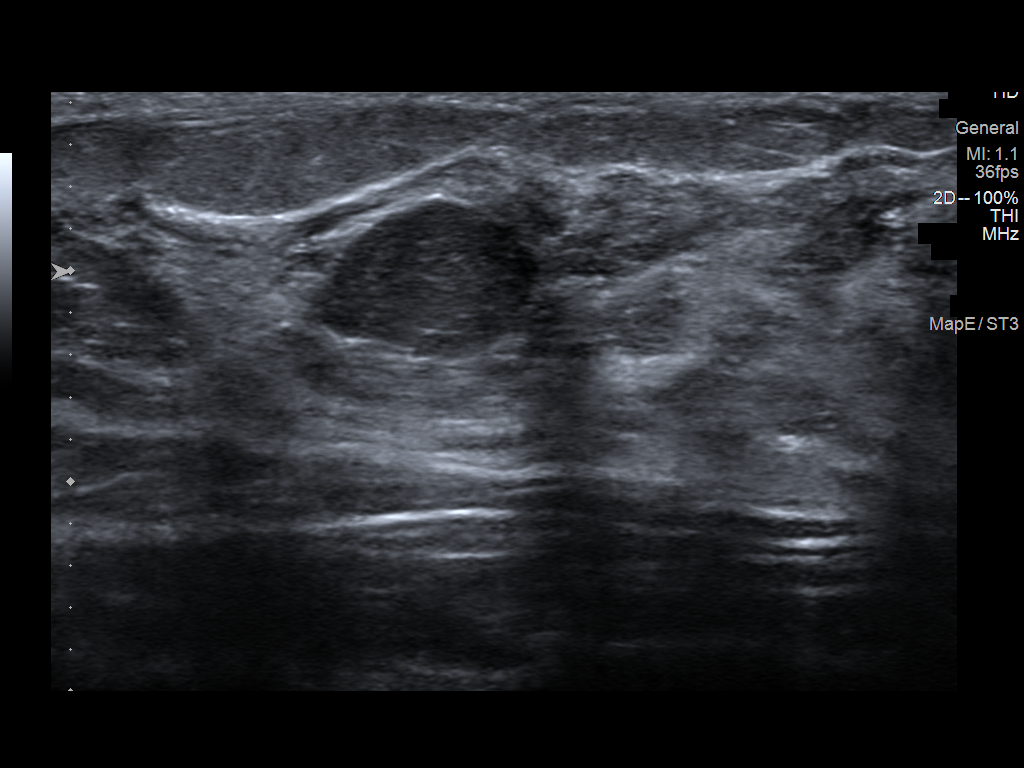
[im 6/6]
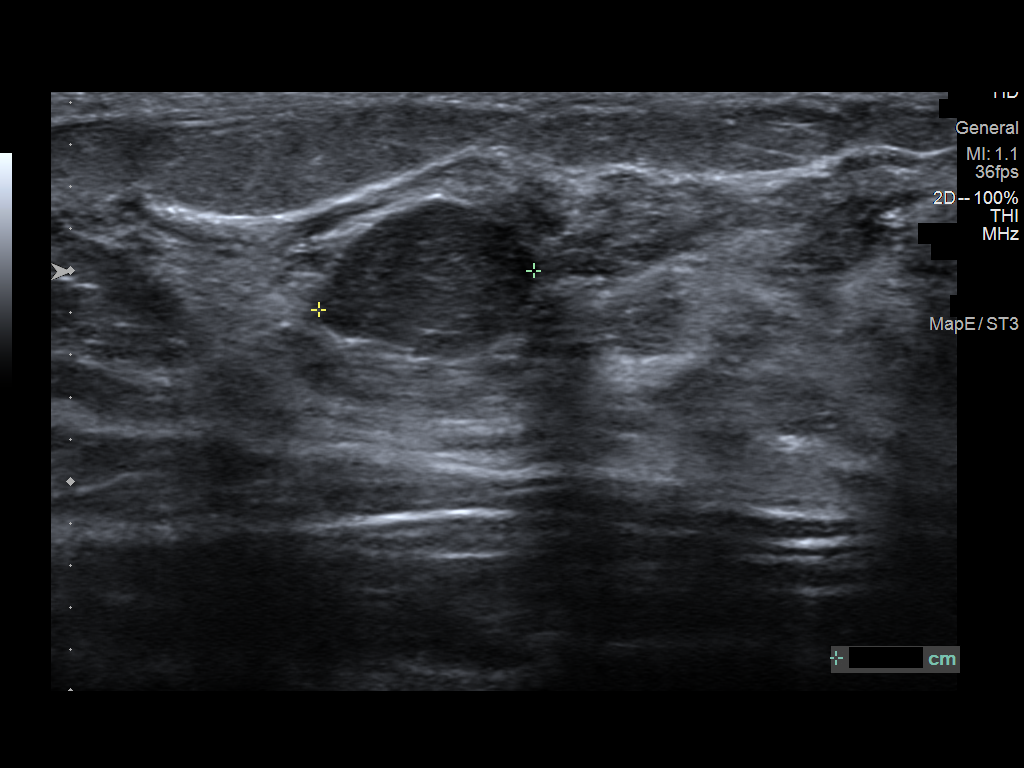

[6 of 6 positions shown; findings below may reference images not displayed]

FINDINGS: On physical exam, a firm palpable mobile mass is identified at the
[DATE] position of the right breast 4 cm from the nipple.

Targeted ultrasound is performed, showing a 1.3 x 0.7 x 1 cm
circumscribed oval hypoechoic parallel mid mass at the [DATE]
position of the right breast 4 cm from the nipple, likely
representing a fibroadenoma.
IMPRESSION: 1.3 cm probable fibroadenoma within the upper right breast. We
discussed management options including excision, ultrasound-guided
core biopsy, and close follow-up. Follow-up ultrasound is
recommended at 6, 12, and 24 months to assess stability. The patient
concurs with this plan.

RECOMMENDATION:
Right breast ultrasound in 6 months.

I have discussed the findings and recommendations with the patient.
Results were also provided in writing at the conclusion of the
visit. If applicable, a reminder letter will be sent to the patient
regarding the next appointment.

BI-RADS CATEGORY  3: Probably benign.

## 2017-10-29 ENCOUNTER — Other Ambulatory Visit: Payer: 59

## 2017-11-04 DIAGNOSIS — X501XXA Overexertion from prolonged static or awkward postures, initial encounter: Secondary | ICD-10-CM | POA: Diagnosis not present

## 2017-11-04 DIAGNOSIS — S93402A Sprain of unspecified ligament of left ankle, initial encounter: Secondary | ICD-10-CM | POA: Diagnosis not present

## 2017-11-06 DIAGNOSIS — S99912A Unspecified injury of left ankle, initial encounter: Secondary | ICD-10-CM | POA: Diagnosis not present

## 2017-11-06 DIAGNOSIS — M25572 Pain in left ankle and joints of left foot: Secondary | ICD-10-CM | POA: Diagnosis not present

## 2017-11-06 DIAGNOSIS — X501XXA Overexertion from prolonged static or awkward postures, initial encounter: Secondary | ICD-10-CM | POA: Diagnosis not present

## 2017-11-12 DIAGNOSIS — M25572 Pain in left ankle and joints of left foot: Secondary | ICD-10-CM | POA: Diagnosis not present

## 2017-11-12 DIAGNOSIS — S93492A Sprain of other ligament of left ankle, initial encounter: Secondary | ICD-10-CM | POA: Diagnosis not present

## 2017-11-17 DIAGNOSIS — M12272 Villonodular synovitis (pigmented), left ankle and foot: Secondary | ICD-10-CM | POA: Diagnosis not present

## 2017-11-17 DIAGNOSIS — M65872 Other synovitis and tenosynovitis, left ankle and foot: Secondary | ICD-10-CM | POA: Diagnosis not present

## 2017-11-17 DIAGNOSIS — M25572 Pain in left ankle and joints of left foot: Secondary | ICD-10-CM | POA: Diagnosis not present

## 2017-11-17 DIAGNOSIS — S93432A Sprain of tibiofibular ligament of left ankle, initial encounter: Secondary | ICD-10-CM | POA: Diagnosis not present

## 2017-11-17 DIAGNOSIS — M79672 Pain in left foot: Secondary | ICD-10-CM | POA: Diagnosis not present

## 2017-11-24 DIAGNOSIS — M25572 Pain in left ankle and joints of left foot: Secondary | ICD-10-CM | POA: Diagnosis not present

## 2017-11-24 DIAGNOSIS — M65872 Other synovitis and tenosynovitis, left ankle and foot: Secondary | ICD-10-CM | POA: Diagnosis not present

## 2017-12-02 ENCOUNTER — Other Ambulatory Visit: Payer: Self-pay

## 2017-12-02 ENCOUNTER — Encounter: Payer: Self-pay | Admitting: Physical Therapy

## 2017-12-02 ENCOUNTER — Ambulatory Visit: Payer: 59 | Attending: Orthopedic Surgery | Admitting: Physical Therapy

## 2017-12-02 DIAGNOSIS — R262 Difficulty in walking, not elsewhere classified: Secondary | ICD-10-CM | POA: Diagnosis not present

## 2017-12-02 DIAGNOSIS — M25571 Pain in right ankle and joints of right foot: Secondary | ICD-10-CM | POA: Insufficient documentation

## 2017-12-02 DIAGNOSIS — M6281 Muscle weakness (generalized): Secondary | ICD-10-CM | POA: Diagnosis not present

## 2017-12-02 DIAGNOSIS — M25572 Pain in left ankle and joints of left foot: Secondary | ICD-10-CM | POA: Insufficient documentation

## 2017-12-02 NOTE — Patient Instructions (Signed)
   ANKLE CIRCLES  Move your ankle in a circular pattern one direction for several repetitions and then reverse the direction.  Repeat 10-15 times clockwise and then 10-15 times counter clockwise, twice a day.     ANKLE ABC's   While in a seated position, write out the alphabet in the air with your big toe.  Your ankle should be moving as you perform this, not your lower leg or hip.   Repeat 1-2 repetitions of the alphabet with each side, twice a day.     SEATED CALF STRETCH - GASTROCNEMIUS  While sitting, use a towel or other strap looped around your foot. Gently pull your ankle back until a stretch is felt along the back of your lower leg. Maintain your target knee straight the entire time.  Hold for 30 seconds, then relax.  Repeat 3 times on the left side, twice a day.      HIP ABDUCTION - SIDELYING  While lying on your side, slowly raise up your top leg to the side. Keep your knee straight and maintain your toes pointed forward the entire time. Keep your leg in-line with your body.  The bottom leg can be bent to stabilize your body.  Repeat 10-15 times each side, twice a day.

## 2017-12-02 NOTE — Therapy (Signed)
[place], [place], [address] Phone: [phone]   Fax:  [phone]  Physical Therapy Evaluation  Patient Details  Name: Kimberly Mcclain MRN: [id number] Date of Birth: Oct 10, 1995 Referring Provider: Almedia Balls    Encounter Date: 12/02/2017  PT End of Session - 12/02/17 1639    Visit Number  1    Number of Visits  13    Date for PT Re-Evaluation  01/13/18    Authorization Type  [facility]     Authorization Time Period  12/02/17 to 01/13/18    PT Start Time  1501    PT Stop Time  1539    PT Time Calculation (min)  38 min    Activity Tolerance  Patient tolerated treatment well    Behavior During Therapy  [facility] for tasks assessed/performed       Past Medical History:  Diagnosis Date  . Anxiety   . Breast mass     Past Surgical History:  Procedure Laterality Date  . LEG SURGERY Left 2014    There were no vitals filed for this visit.   Subjective Assessment - 12/02/17 1502    Subjective  I twisted my ankle really badly and it overstretched a ligament. It happened June 30th. I was only walking, I did not step in a hole or off of a curb, it just twisted. I am in the boot for now, but see my podiatrist next week and hope to be cleared of the boot. I've had some back and hip pain since wearing the boot. I'd had an injury to my right ankle beofre I had this injury, it is still weak and not 100%. I had surgery on my left leg in 2014 to make a tendon longer (gastrocnemius resection) and I still have issues from this.     How long can you sit comfortably?  no limits     How long can you stand comfortably?  no limits     How long can you walk comfortably?  36ft without boot     Patient Stated Goals  have both ankles be stronger, stop painful clicking in ankle     Currently in Pain?  No/denies at worst, left ankle 6/10 when standing and walking          [facility] PT Assessment - 12/02/17 0001      Assessment    Medical Diagnosis  ankle instability     Referring Provider  Almedia Balls     Onset Date/Surgical Date  12/02/17    Next MD Visit  Dr. Lynann Bologna on August 12th     Prior Therapy  none       Precautions   Precautions  None      Restrictions   Weight Bearing Restrictions  Yes    LLE Weight Bearing  Weight bearing as tolerated    Other Position/Activity Restrictions  in boot       Balance Screen   Has the patient fallen in the past 6 months  No    Has the patient had a decrease in activity level because of a fear of falling?   No    Is the patient reluctant to leave their home because of a fear of falling?   No      Prior Function   Level of Independence  Independent;Independent with basic ADLs;Independent with gait;Independent with transfers    Friendly  Unemployed    Leisure  gym  ROM / Strength   AROM / PROM / Strength  AROM;Strength      AROM   AROM Assessment Site  Hip;Knee;Ankle    Right/Left Hip  Right;Left    Right/Left Knee  Right;Left    Right/Left Ankle  Right;Left    Right Ankle Dorsiflexion  18    Right Ankle Plantar Flexion  56    Right Ankle Inversion  30    Right Ankle Eversion  14    Left Ankle Dorsiflexion  0    Left Ankle Plantar Flexion  49    Left Ankle Inversion  15    Left Ankle Eversion  15      Strength   Strength Assessment Site  Hip;Knee;Ankle    Right/Left Hip  Left;Right    Right Hip Flexion  5/5    Right Hip Extension  4/5    Right Hip ABduction  4/5    Left Hip Flexion  5/5    Left Hip Extension  4/5    Left Hip ABduction  3/5    Right/Left Knee  Left;Right    Right Knee Flexion  4+/5    Right Knee Extension  4+/5    Left Knee Flexion  4/5    Left Knee Extension  4+/5    Right/Left Ankle  Right;Left    Right Ankle Dorsiflexion  5/5    Right Ankle Plantar Flexion  3/5    Right Ankle Inversion  5/5    Right Ankle Eversion  5/5    Left Ankle Dorsiflexion  3/5    Left Ankle Plantar Flexion  -- unable due to pain     Left Ankle  Inversion  4+/5    Left Ankle Eversion  4/5      Ambulation/Gait   Gait Comments  antalgic pattern, bilateral trendelenburg, reduced anlke DF L, reduced toe pushoff during gait, reduced great toe extension during gait                 Objective measurements completed on examination: See above findings.      [place] [facility] PT Treatment/Exercise - 12/02/17 0001      Exercises   Exercises  Ankle;Knee/Hip      Knee/Hip Exercises: Sidelying   Hip ABduction  Both;1 set;10 reps      Ankle Exercises: Stretches   Gastroc Stretch  30 seconds;2 reps      Ankle Exercises: Seated   ABC's  1 rep B     Ankle Circles/Pumps  10 reps;Other (comment) B, CW and CCW              PT Education - 12/02/17 1639    Education Details  exam findings, prognosis, HEP, POC    Person(s) Educated  Patient    Methods  Explanation;Demonstration;Handout    Comprehension  Verbalized understanding;Returned demonstration       PT Short Term Goals - 12/02/17 1642      PT SHORT TERM GOAL #1   Title  Patient to be compliant with appropriate HEP, to be updated PRN     Time  3    Period  Weeks    Status  New    Target Date  12/23/17      PT SHORT TERM GOAL #2   Title  Patient to demonstrate ankle ROM as being symmetrical on all measured planes in order to improve gait mechanics and gross mobility     Time  3    Period  Weeks  Status  New      PT SHORT TERM GOAL #3   Title  Patient to report pain as being no more than 3/10 at worst when standing or walking for at least 30 minutes in order to show improved QOL and mobility     Time  3    Period  Weeks    Status  New        PT Long Term Goals - 12/02/17 1644      PT LONG TERM GOAL #1   Title  Patient to demonstrate MMT as having improved by at least one grade in all tested groups, and will score at least 4/5 for bilateral plantar flexor strength in order to assist in improving ankle stability and reducing pain     Time  6    Period   Weeks    Status  New    Target Date  01/13/18      PT LONG TERM GOAL #2   Title  Patient to be able to ambulate on even and uneven surfaces with consistent and quality heel strike and toe-off bilaterally in order to show improved gait mechanics and reduced stress on ankles     Time  6    Period  Weeks    Status  New      PT LONG TERM GOAL #3   Title  Patient to be able to maintain SLS for at least 15 seconds on solid surface/10 seconds on foam pad in order to show improved ankle stability and balance     Time  6    Period  Weeks    Status  New      PT LONG TERM GOAL #4   Title  Patient to be able to wear high heels for no more than 2 hours with ankle pain no more than 2/10 and no ankle twists or falls in order to show improved ankle stability and 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 Her right ankle is painful and feels weak from compensating for her left, she also reports history of gastrocnemius resection surgery on the left side. Examination reveals left ankle stiffness as would be expected from being in a walking boot for extended period of time, localized edema lateral L ankle, functional strength deficit in B LEs and hips, and significant impairment in gait and balance based tasks. She will benefit from skilled PT services to address functional deficits, reduce pain, and assist in restoring normalized gait mechanics moving forward.     History and Personal Factors relevant to plan of care:  gastroc resection L LE (patient reported), chronic bilateral ankle instability     Clinical Presentation  Stable    Clinical Decision Making  Low    Rehab Potential  Good    PT Frequency  2x /  week    PT Duration  6 weeks    PT Treatment/Interventions  ADLs/Self Care Home Management;Biofeedback;Cryotherapy;Electrical Stimulation;Iontophoresis 4mg /ml Dexamethasone;Moist Heat;Ultrasound;Gait training;Stair training;Functional mobility training;Therapeutic activities;Therapeutic exercise;Balance training;Neuromuscular re-education;Patient/family education;Manual techniques;Passive range of motion;Dry needling;Taping  PT Next Visit Plan  review HEP and goals; ankle mobility and strength, gross LE strengthening. Progress to CKC exercise when pain free and cleared from boot.     PT Home Exercise Plan  Eval: hip ABD, ankle circles, ankle alphabet, gastroc stretch     Consulted and Agree with Plan of Care  Patient       Patient will benefit from skilled therapeutic intervention in order to improve the following deficits and impairments:  Abnormal gait, Increased fascial restricitons, Pain, Decreased range of motion, Decreased strength, Decreased balance, Difficulty walking, Increased edema, Impaired flexibility  Visit Diagnosis: Pain in left ankle and joints of left foot - Plan: PT plan of care cert/re-cert  Pain in right ankle and joints of right foot - Plan: PT plan of care cert/re-cert  Muscle weakness (generalized) - Plan: PT plan of care cert/re-cert  Difficulty in walking, not elsewhere classified - Plan: PT plan of care cert/re-cert     Problem List There are no active problems to display for this patient.   Deniece Ree PT, DPT, [place]  Supplemental Physical Therapist [place]   Pager [place] [facility] [address] [place], [place], [address] Phone: [phone]   Fax:  [phone]  Name: Chaniece Barbato MRN: [id number] Date of Birth: May 15, 1995

## 2017-12-04 DIAGNOSIS — L700 Acne vulgaris: Secondary | ICD-10-CM | POA: Diagnosis not present

## 2017-12-08 ENCOUNTER — Encounter: Payer: Self-pay | Admitting: Physical Therapy

## 2017-12-08 ENCOUNTER — Ambulatory Visit: Payer: 59 | Attending: Orthopedic Surgery | Admitting: Physical Therapy

## 2017-12-08 DIAGNOSIS — M25572 Pain in left ankle and joints of left foot: Secondary | ICD-10-CM | POA: Insufficient documentation

## 2017-12-08 DIAGNOSIS — M6281 Muscle weakness (generalized): Secondary | ICD-10-CM | POA: Insufficient documentation

## 2017-12-08 DIAGNOSIS — M25571 Pain in right ankle and joints of right foot: Secondary | ICD-10-CM | POA: Insufficient documentation

## 2017-12-08 DIAGNOSIS — R262 Difficulty in walking, not elsewhere classified: Secondary | ICD-10-CM | POA: Insufficient documentation

## 2017-12-08 NOTE — Therapy (Signed)
[place], [place], [address] Phone: [phone]   Fax:  [phone]  Physical Therapy Treatment  Patient Details  Name: Kimberly Mcclain MRN: [id number] Date of Birth: November 21, 1995 Referring Provider: Almedia Balls    Encounter Date: 12/08/2017  PT End of Session - 12/08/17 1254    Visit Number  2    Number of Visits  13    Date for PT Re-Evaluation  01/13/18    Authorization Type  [facility]     Authorization Time Period  12/02/17 to 01/13/18    PT Start Time  1059    PT Stop Time  1147    PT Time Calculation (min)  48 min    Activity Tolerance  Patient tolerated treatment well    Behavior During Therapy  [facility] for tasks assessed/performed       Past Medical History:  Diagnosis Date  . Anxiety   . Breast mass     Past Surgical History:  Procedure Laterality Date  . LEG SURGERY Left 2014    There were no vitals filed for this visit.  Subjective Assessment - 12/08/17 1059    Subjective  "My ankle feels pretty good since I started the PT exercises at home last week. I am not having any pain today."    Currently in Pain?  No/denies    Pain Score  0-No pain    Pain Location  Ankle    Pain Orientation  Left         [facility] PT Assessment - 12/08/17 0001      AROM   Right Ankle Dorsiflexion  6 from neutral    Right Ankle Plantar Flexion  38 from neutral    Right Ankle Inversion  26 from neutral    Right Ankle Eversion  22 from neutral    Left Ankle Dorsiflexion  0 from neutral    Left Ankle Plantar Flexion  30 from neutral    Left Ankle Inversion  16 from neutral    Left Ankle Eversion  15 from neutral      Strength   Right Hip Flexion  5/5    Right Hip Extension  4/5    Right Hip ABduction  4/5    Left Hip Flexion  5/5    Left Hip Extension  4/5    Left Hip ABduction  4-/5    Right Knee Flexion  4/5    Right Knee Extension  4+/5    Left Knee Flexion  3+/5    Left Knee Extension  4+/5     Right Ankle Dorsiflexion  5/5    Right Ankle Plantar Flexion  4/5 able to complete 20 heel raises    Right Ankle Inversion  4/5    Right Ankle Eversion  5/5    Left Ankle Dorsiflexion  5/5    Left Ankle Plantar Flexion  3+/5 completed in long sit position    Left Ankle Inversion  4/5    Left Ankle Eversion  4/5                   [facility] Adult PT Treatment/Exercise - 12/08/17 0001      Manual Therapy   Manual Therapy  Joint mobilization    Joint Mobilization  Grade 3-4 posterior talar glide to inc dorsiflexion      Ankle Exercises: Aerobic   Nustep  L4 x 5 min UE and LE      Ankle Exercises:  Seated   Other Seated Ankle Exercises  4 way ankle red therband x 15 ea direction    Other Seated Ankle Exercises  seated single leg heel raises L x 15      Ankle Exercises: Standing   Heel Raises  10 reps x 2 sets. 1st set double leg; 2nd set down with L only      Ankle Exercises: Stretches   Gastroc Stretch  30 seconds;1 rep passive stretch by PT in long sit position               PT Short Term Goals - 12/02/17 1642      PT SHORT TERM GOAL #1   Title  Patient to be compliant with appropriate HEP, to be updated PRN     Time  3    Period  Weeks    Status  New    Target Date  12/23/17      PT SHORT TERM GOAL #2   Title  Patient to demonstrate ankle ROM as being symmetrical on all measured planes in order to improve gait mechanics and gross mobility     Time  3    Period  Weeks    Status  New      PT SHORT TERM GOAL #3   Title  Patient to report pain as being no more than 3/10 at worst when standing or walking for at least 30 minutes in order to show improved QOL and mobility     Time  3    Period  Weeks    Status  New        PT Long Term Goals - 12/02/17 1644      PT LONG TERM GOAL #1   Title  Patient to demonstrate MMT as having improved by at least one grade in all tested groups, and will score at least 4/5 for bilateral plantar flexor strength in  order to assist in improving ankle stability and reducing pain     Time  6    Period  Weeks    Status  New    Target Date  01/13/18      PT LONG TERM GOAL #2   Title  Patient to be able to ambulate on even and uneven surfaces with consistent and quality heel strike and toe-off bilaterally in order to show improved gait mechanics and reduced stress on ankles     Time  6    Period  Weeks    Status  New      PT LONG TERM GOAL #3   Title  Patient to be able to maintain SLS for at least 15 seconds on solid surface/10 seconds on foam pad in order to show improved ankle stability and balance     Time  6    Period  Weeks    Status  New      PT LONG TERM GOAL #4   Title  Patient to be able to wear high heels for no more than 2 hours with ankle pain no more than 2/10 and no ankle twists or falls in order to show improved ankle stability and 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 She reports no pain  today. Pt already making some progress in regards to strength and mobility as compared to initial evaluation. Treatment focused on progression of strengthening ankle musculature. She is now able to complete heel raises without pain in her L ankle.     PT Treatment/Interventions  ADLs/Self Care Home Management;Biofeedback;Cryotherapy;Electrical Stimulation;Iontophoresis 4mg /ml Dexamethasone;Moist Heat;Ultrasound;Gait training;Stair training;Functional mobility training;Therapeutic activities;Therapeutic exercise;Balance training;Neuromuscular re-education;Patient/family education;Manual techniques;Passive range of motion;Dry needling;Taping    PT Next Visit Plan  review HEP and goals; ankle mobility and strength, gross LE strengthening. Progress to CKC exercise when pain free and cleared from boot, ankle mobs prn, heel raises, BAPS board, gait training  without boot    PT Home Exercise Plan  Eval: hip ABD, ankle circles, ankle alphabet, gastroc stretch; 4 way ankle red theraband, standing heel raises'     Consulted and Agree with Plan of Care  Patient       Patient will benefit from skilled therapeutic intervention in order to improve the following deficits and impairments:  Abnormal gait, Increased fascial restricitons, Pain, Decreased range of motion, Decreased strength, Decreased balance, Difficulty walking, Increased edema, Impaired flexibility  Visit Diagnosis: Pain in left ankle and joints of left foot  Pain in right ankle and joints of right foot  Difficulty in walking, not elsewhere classified  Muscle weakness (generalized)     Problem List There are no active problems to display for this patient.  Worthy Flank, SPT 12/08/17 1:02 PM   [place] [facility] [address] [place], [place], [address] Phone: [phone]   Fax:  [phone]  Name: Kimberly Mcclain MRN: [id number] Date of Birth: [birth year]

## 2017-12-11 ENCOUNTER — Ambulatory Visit: Payer: 59 | Admitting: Physical Therapy

## 2017-12-11 ENCOUNTER — Encounter: Payer: Self-pay | Admitting: Physical Therapy

## 2017-12-11 DIAGNOSIS — M25572 Pain in left ankle and joints of left foot: Secondary | ICD-10-CM | POA: Diagnosis not present

## 2017-12-11 DIAGNOSIS — R262 Difficulty in walking, not elsewhere classified: Secondary | ICD-10-CM | POA: Diagnosis not present

## 2017-12-11 DIAGNOSIS — M25571 Pain in right ankle and joints of right foot: Secondary | ICD-10-CM

## 2017-12-11 DIAGNOSIS — M6281 Muscle weakness (generalized): Secondary | ICD-10-CM

## 2017-12-11 NOTE — Therapy (Signed)
[place], [place], [address] Phone: [phone]   Fax:  [phone]  Physical Therapy Treatment  Patient Details  Name: Kimberly Mcclain MRN: [id number] Date of Birth: 10-29-1995 Referring Provider: Almedia Balls    Encounter Date: 12/11/2017  PT End of Session - 12/11/17 1151    Visit Number  3    Number of Visits  13    Date for PT Re-Evaluation  01/13/18    Authorization Type  [facility]     Authorization Time Period  12/02/17 to 01/13/18    PT Start Time  1111    PT Stop Time  1151    PT Time Calculation (min)  40 min    Activity Tolerance  Patient tolerated treatment well    Behavior During Therapy  [facility] for tasks assessed/performed       Past Medical History:  Diagnosis Date  . Anxiety   . Breast mass     Past Surgical History:  Procedure Laterality Date  . LEG SURGERY Left 2014    There were no vitals filed for this visit.  Subjective Assessment - 12/11/17 1115    Subjective  "No pain today. I have been cleared from wearing my boot so no complaints. I can walk on it fine, I just have to go a little slower."    Currently in Pain?  No/denies    Pain Score  0-No pain    Pain Location  Ankle    Pain Orientation  Left                       [facility] Adult PT Treatment/Exercise - 12/11/17 0001      Manual Therapy   Manual Therapy  Joint mobilization    Joint Mobilization  Grade 3-4 posterior talar glide to inc dorsiflexion      Ankle Exercises: Aerobic   Stationary Bike  L3 x 5 min      Ankle Exercises: Stretches   Gastroc Stretch  2 reps;30 seconds      Ankle Exercises: Standing   Heel Raises  15 reps    Other Standing Ankle Exercises  mobilization with movement standing with blue band around distal tib fib, pt moving into knee flexion while keeping heel on ground. x 15 reps      Ankle Exercises: Seated   BAPS Weights (lbs)  level 2    BAPS Limitations  pt struggles  getting board on floor posterior lateral aspect    Other Seated Ankle Exercises  Seated posterior tib strengthening with yellow band 2 x 15             PT Education - 12/11/17 1150    Education Details  HEP update    Person(s) Educated  Patient    Methods  Explanation;Handout    Comprehension  Verbalized understanding                 Plan - 12/11/17 1151    Clinical Impression Statement  Pt arrives wearing ankle brace; states she was cleared of wearing boot. Treatment focused on mobilization to gain dorsiflexion in addition to ankle strengthening. Pt tolerated treatment well and reports no increases in pain at end of session.     PT Treatment/Interventions  ADLs/Self Care Home Management;Biofeedback;Cryotherapy;Electrical Stimulation;Iontophoresis 4mg /ml Dexamethasone;Moist Heat;Ultrasound;Gait training;Stair training;Functional mobility training;Therapeutic activities;Therapeutic exercise;Balance training;Neuromuscular re-education;Patient/family education;Manual techniques;Passive range of motion;Dry needling;Taping    PT Next Visit Plan  review HEP and  goals; ankle mobility and strength, gross LE strengthening. Progress to CKC exercise when pain free and cleared from boot, ankle mobs prn, heel raises, BAPS board, gait training without boot    PT Home Exercise Plan  Eval: hip ABD, ankle circles, ankle alphabet, gastroc stretch; 4 way ankle red theraband, standing heel raises'     Consulted and Agree with Plan of Care  Patient       Patient will benefit from skilled therapeutic intervention in order to improve the following deficits and impairments:  Abnormal gait, Increased fascial restricitons, Pain, Decreased range of motion, Decreased strength, Decreased balance, Difficulty walking, Increased edema, Impaired flexibility  Visit Diagnosis: Pain in left ankle and joints of left foot  Pain in right ankle and joints of right foot  Difficulty in walking, not elsewhere  classified  Muscle weakness (generalized)     Problem List There are no active problems to display for this patient.   Worthy Flank, SPT 12/11/17 11:59 AM    [place], [place], [address] Phone: [phone]   Fax:  [phone]  Name: Kimberly Mcclain MRN: [id number] Date of Birth: May 09, 1995

## 2017-12-15 DIAGNOSIS — M25572 Pain in left ankle and joints of left foot: Secondary | ICD-10-CM | POA: Diagnosis not present

## 2017-12-15 DIAGNOSIS — G43909 Migraine, unspecified, not intractable, without status migrainosus: Secondary | ICD-10-CM | POA: Diagnosis not present

## 2017-12-15 DIAGNOSIS — E785 Hyperlipidemia, unspecified: Secondary | ICD-10-CM | POA: Diagnosis not present

## 2017-12-15 DIAGNOSIS — Z0000 Encounter for general adult medical examination without abnormal findings: Secondary | ICD-10-CM | POA: Diagnosis not present

## 2017-12-15 DIAGNOSIS — K219 Gastro-esophageal reflux disease without esophagitis: Secondary | ICD-10-CM | POA: Diagnosis not present

## 2017-12-15 DIAGNOSIS — F330 Major depressive disorder, recurrent, mild: Secondary | ICD-10-CM | POA: Diagnosis not present

## 2017-12-15 DIAGNOSIS — Z5181 Encounter for therapeutic drug level monitoring: Secondary | ICD-10-CM | POA: Diagnosis not present

## 2017-12-18 ENCOUNTER — Ambulatory Visit: Payer: 59 | Admitting: Physical Therapy

## 2017-12-18 ENCOUNTER — Encounter: Payer: Self-pay | Admitting: Physical Therapy

## 2017-12-18 DIAGNOSIS — M6281 Muscle weakness (generalized): Secondary | ICD-10-CM | POA: Diagnosis not present

## 2017-12-18 DIAGNOSIS — M25571 Pain in right ankle and joints of right foot: Secondary | ICD-10-CM

## 2017-12-18 DIAGNOSIS — R262 Difficulty in walking, not elsewhere classified: Secondary | ICD-10-CM | POA: Diagnosis not present

## 2017-12-18 DIAGNOSIS — M25572 Pain in left ankle and joints of left foot: Secondary | ICD-10-CM | POA: Diagnosis not present

## 2017-12-18 NOTE — Therapy (Signed)
[place], [place], [address] Phone: [phone]   Fax:  [phone]  Physical Therapy Treatment  Patient Details  Name: Kimberly Mcclain MRN: [id number] Date of Birth: [birth year] Referring Provider: Almedia Balls    Encounter Date: 12/18/2017  PT End of Session - 12/18/17 1018    Visit Number  4    Number of Visits  13    Date for PT Re-Evaluation  01/13/18    Authorization Type  [facility]     Authorization Time Period  12/02/17 to 01/13/18    PT Start Time  1015    PT Stop Time  1055    PT Time Calculation (min)  40 min       Past Medical History:  Diagnosis Date  . Anxiety   . Breast mass     Past Surgical History:  Procedure Laterality Date  . LEG SURGERY Left 2014    There were no vitals filed for this visit.  Subjective Assessment - 12/18/17 1018    Subjective  Consistently getter better since starting PT.     Currently in Pain?  No/denies         [facility] PT Assessment - 12/18/17 0001      AROM   Left Ankle Dorsiflexion  10    Left Ankle Plantar Flexion  44                   [facility] Adult PT Treatment/Exercise - 12/18/17 0001      Knee/Hip Exercises: Sidelying   Hip ABduction  20 reps      Ankle Exercises: Standing   SLS  60 sec x 2 with intermittent UE touch    Heel Raises  10 reps   then 10 with left eccentric lowering   Other Standing Ankle Exercises  mobilization with movement standing with blue band around distal tib fib, pt moving into knee flexion while keeping heel on ground. x 15 reps   add in 5 second hold for last couple of reps     Ankle Exercises: Seated   BAPS Weights (lbs)  level 3   2 x 10 clockwise and counter clockwise   Other Seated Ankle Exercises  Seated posterior tib strengthening with yellow band 2 x 15      Ankle Exercises: Stretches   Gastroc Stretch  2 reps;30 seconds   standing vs edge of step      Ankle Exercises: Aerobic    Elliptical  L1 Ramp 1 x 5 minutes                PT Short Term Goals - 12/02/17 1642      PT SHORT TERM GOAL #1   Title  Patient to be compliant with appropriate HEP, to be updated PRN     Time  3    Period  Weeks    Status  New    Target Date  12/23/17      PT SHORT TERM GOAL #2   Title  Patient to demonstrate ankle ROM as being symmetrical on all measured planes in order to improve gait mechanics and gross mobility     Time  3    Period  Weeks    Status  New      PT SHORT TERM GOAL #3   Title  Patient to report pain as being no more than 3/10 at worst when standing or walking for at least 30 minutes in  order to show improved QOL and mobility     Time  3    Period  Weeks    Status  New        PT Long Term Goals - 12/02/17 1644      PT LONG TERM GOAL #1   Title  Patient to demonstrate MMT as having improved by at least one grade in all tested groups, and will score at least 4/5 for bilateral plantar flexor strength in order to assist in improving ankle stability and reducing pain     Time  6    Period  Weeks    Status  New    Target Date  01/13/18      PT LONG TERM GOAL #2   Title  Patient to be able to ambulate on even and uneven surfaces with consistent and quality heel strike and toe-off bilaterally in order to show improved gait mechanics and reduced stress on ankles     Time  6    Period  Weeks    Status  New      PT LONG TERM GOAL #3   Title  Patient to be able to maintain SLS for at least 15 seconds on solid surface/10 seconds on foam pad in order to show improved ankle stability and balance     Time  6    Period  Weeks    Status  New      PT LONG TERM GOAL #4   Title  Patient to be able to wear high heels for no more than 2 hours with ankle pain no more than 2/10 and no ankle twists or falls in order to show improved ankle stability and 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 Pt reports tingling in lower leg with DF mob HEP exercise. After correcting technique she no longer c/ o tingling. Advanced to Ellptical and SLS trials today. Pt reports tightness in ankle, no increased pain.     PT Next Visit Plan  review HEP and goals; ankle mobility and strength, gross LE strengthening. Progress to CKC exercise when pain free and cleared from boot, ankle mobs prn, heel raises, BAPS board, gait training without boot    PT Home Exercise Plan  Eval: hip ABD, ankle circles, ankle alphabet, gastroc stretch; 4 way ankle red theraband, standing heel raises' SLS    Consulted and Agree with Plan of Care  Patient       Patient will benefit from skilled therapeutic intervention in order to improve the following deficits and impairments:  Abnormal gait, Increased fascial restricitons, Pain, Decreased range of motion, Decreased strength, Decreased balance, Difficulty walking, Increased edema, Impaired flexibility  Visit Diagnosis: Pain in left ankle and joints of left foot  Pain in right ankle and joints of right foot  Difficulty in walking, not elsewhere classified  Muscle weakness (generalized)     Problem List There are no active problems to display for this patient.   Dorene Ar, [place] 12/18/2017, 10:58 AM  [facility] [address] [place], [place], [address] Phone: [phone]   Fax:  [phone]  Name: Kimberly Mcclain MRN: [id number] Date of Birth: 09-24-1995

## 2017-12-19 ENCOUNTER — Ambulatory Visit: Payer: 59 | Admitting: Physical Therapy

## 2017-12-19 ENCOUNTER — Encounter: Payer: Self-pay | Admitting: Physical Therapy

## 2017-12-19 DIAGNOSIS — R262 Difficulty in walking, not elsewhere classified: Secondary | ICD-10-CM

## 2017-12-19 DIAGNOSIS — M6281 Muscle weakness (generalized): Secondary | ICD-10-CM

## 2017-12-19 DIAGNOSIS — M25571 Pain in right ankle and joints of right foot: Secondary | ICD-10-CM

## 2017-12-19 DIAGNOSIS — M25572 Pain in left ankle and joints of left foot: Secondary | ICD-10-CM | POA: Diagnosis not present

## 2017-12-19 NOTE — Therapy (Signed)
[place], [place], [address] Phone: [phone]   Fax:  [phone]  Physical Therapy Treatment  Patient Details  Name: Kimberly Mcclain MRN: [id number] Date of Birth: Dec 13, 1995 Referring Provider: Almedia Balls    Encounter Date: 12/19/2017  PT End of Session - 12/19/17 1120    Visit Number  5    Number of Visits  13    Date for PT Re-Evaluation  01/13/18    Authorization Type  [facility]     Authorization Time Period  12/02/17 to 01/13/18    PT Start Time  1101    PT Stop Time  1150    PT Time Calculation (min)  49 min    Activity Tolerance  Patient tolerated treatment well    Behavior During Therapy  [facility] for tasks assessed/performed       Past Medical History:  Diagnosis Date  . Anxiety   . Breast mass     Past Surgical History:  Procedure Laterality Date  . LEG SURGERY Left 2014    There were no vitals filed for this visit.  Subjective Assessment - 12/19/17 1114    Subjective  "I am feeling a little tight, but I am okay. The pain is basically non-existent."    Currently in Pain?  No/denies    Pain Score  0-No pain    Pain Location  Ankle    Pain Orientation  Left                       [facility] Adult PT Treatment/Exercise - 12/19/17 0001      Modalities   Modalities  Cryotherapy      Cryotherapy   Number Minutes Cryotherapy  10 Minutes    Cryotherapy Location  Ankle   left   Type of Cryotherapy  Ice pack      Ankle Exercises: Aerobic   Elliptical  3 ramp L4 x 5 min      Ankle Exercises: Stretches   Gastroc Stretch  2 reps;30 seconds   slant board     Ankle Exercises: Standing   SLS  60 sec x 2 with intermittent UE touch    Rebounder  2 x 10 red ball bilateral    Heel Raises  10 reps   x 2. with eccentric lowering   Other Standing Ankle Exercises  wall squats 2 x 15   2nd set hold 5 seconds at bottom     Ankle Exercises: Seated   BAPS Weights (lbs)  level  3   2 x 10 clockwise and counter clockwise   Other Seated Ankle Exercises  Seated posterior tib strengthening with red band 2 x 15    Other Seated Ankle Exercises  R ankle inversion, eversion, DF with red band 1 x 10               PT Short Term Goals - 12/02/17 1642      PT SHORT TERM GOAL #1   Title  Patient to be compliant with appropriate HEP, to be updated PRN     Time  3    Period  Weeks    Status  New    Target Date  12/23/17      PT SHORT TERM GOAL #2   Title  Patient to demonstrate ankle ROM as being symmetrical on all measured planes in order to improve gait mechanics and gross mobility     Time  3    Period  Weeks    Status  New      PT SHORT TERM GOAL #3   Title  Patient to report pain as being no more than 3/10 at worst when standing or walking for at least 30 minutes in order to show improved QOL and mobility     Time  3    Period  Weeks    Status  New        PT Long Term Goals - 12/02/17 1644      PT LONG TERM GOAL #1   Title  Patient to demonstrate MMT as having improved by at least one grade in all tested groups, and will score at least 4/5 for bilateral plantar flexor strength in order to assist in improving ankle stability and reducing pain     Time  6    Period  Weeks    Status  New    Target Date  01/13/18      PT LONG TERM GOAL #2   Title  Patient to be able to ambulate on even and uneven surfaces with consistent and quality heel strike and toe-off bilaterally in order to show improved gait mechanics and reduced stress on ankles     Time  6    Period  Weeks    Status  New      PT LONG TERM GOAL #3   Title  Patient to be able to maintain SLS for at least 15 seconds on solid surface/10 seconds on foam pad in order to show improved ankle stability and balance     Time  6    Period  Weeks    Status  New      PT LONG TERM GOAL #4   Title  Patient to be able to wear high heels for no more than 2 hours with ankle pain no more than 2/10 and no  ankle twists or falls in order to show improved ankle stability and 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 Reports she is a little stiff after single leg balance yesterday. Treatment focused on ankle strengthening and stability in addition to balance. Pt tolerated treatment well.     PT Treatment/Interventions  ADLs/Self Care Home Management;Biofeedback;Cryotherapy;Electrical Stimulation;Iontophoresis 4mg /ml Dexamethasone;Moist Heat;Ultrasound;Gait training;Stair training;Functional mobility training;Therapeutic activities;Therapeutic exercise;Balance training;Neuromuscular re-education;Patient/family education;Manual techniques;Passive range of motion;Dry needling;Taping    PT Next Visit Plan  review HEP and goals; ankle mobility and strength, gross LE strengthening. Progress to CKC exercise when pain free and cleared from boot, ankle mobs prn, heel raises, BAPS board, gait training without boot    PT Home Exercise Plan  Eval: hip ABD, ankle circles, ankle alphabet, gastroc stretch; 4 way ankle red theraband, standing heel raises' SLS    Consulted and Agree with Plan of Care  Patient       Patient will benefit from skilled therapeutic intervention in order to improve the following deficits and impairments:  Abnormal gait, Increased fascial restricitons, Pain, Decreased range of motion, Decreased strength, Decreased balance, Difficulty walking, Increased edema, Impaired flexibility  Visit Diagnosis: Pain in left ankle and joints of left foot  Pain in right ankle and joints of right foot  Difficulty  in walking, not elsewhere classified  Muscle weakness (generalized)     Problem List There are no active problems to display for this patient.  Worthy Flank, SPT 12/19/17 11:42 AM   [place], [place], [address] Phone: [phone]   Fax:  [phone]  Name: Kimberly Mcclain MRN: [id number] Date of Birth: 01/29/1996

## 2017-12-22 ENCOUNTER — Ambulatory Visit: Payer: 59 | Admitting: Physical Therapy

## 2017-12-22 ENCOUNTER — Encounter: Payer: Self-pay | Admitting: Physical Therapy

## 2017-12-22 DIAGNOSIS — M25572 Pain in left ankle and joints of left foot: Secondary | ICD-10-CM | POA: Diagnosis not present

## 2017-12-22 DIAGNOSIS — R262 Difficulty in walking, not elsewhere classified: Secondary | ICD-10-CM | POA: Diagnosis not present

## 2017-12-22 DIAGNOSIS — M6281 Muscle weakness (generalized): Secondary | ICD-10-CM

## 2017-12-22 DIAGNOSIS — M25571 Pain in right ankle and joints of right foot: Secondary | ICD-10-CM | POA: Diagnosis not present

## 2017-12-22 NOTE — Therapy (Signed)
[name] [address] [place], [place], [address] Phone: [phone]   Fax:  [phone]  Physical Therapy Treatment  Patient Details  Name: Kimberly Mcclain MRN: [id number] Date of Birth: 03/30/1996 Referring Provider: Anna Voytek    Encounter Date: 12/22/2017  PT End of Session - 12/22/17 1004    Visit Number  6    Number of Visits  13    Date for PT Re-Evaluation  01/13/18    Authorization Type  [place] [employer]     PT Start Time  1006    PT Stop Time  1051    PT Time Calculation (min)  45 min    Activity Tolerance  Patient tolerated treatment well    Behavior During Therapy  WFL for tasks assessed/performed       Past Medical History:  Diagnosis Date  . Anxiety   . Breast mass     Past Surgical History:  Procedure Laterality Date  . LEG SURGERY Left 2014    There were no vitals filed for this visit.  Subjective Assessment - 12/22/17 1003    Subjective  "doing pretty good, no pain or any issues"     Currently in Pain?  No/denies    Pain Score  0-No pain    Pain Orientation  Left         [facility] PT Assessment - 12/22/17 1010      AROM   Left Ankle Dorsiflexion  8    Left Ankle Plantar Flexion  50    Left Ankle Inversion  20    Left Ankle Eversion  18      Strength   Left Ankle Dorsiflexion  5/5    Left Ankle Plantar Flexion  5/5    Left Ankle Inversion  5/5    Left Ankle Eversion  5/5                   [facility] Adult PT Treatment/Exercise - 12/22/17 1017      Knee/Hip Exercises: Sidelying   Hip ABduction  Left   sustained abduction with hip circles going to fatigue     Ankle Exercises: Aerobic   Elliptical  3 ramp L4 x 5 min      Ankle Exercises: Stretches   Soleus Stretch  2 reps;30 seconds   slant board   Gastroc Stretch  2 reps;30 seconds   slant board     Ankle Exercises: Standing   Rocker Board  Other (comment)   DF/ PF x 1 min, inversion/ Eversion x 1 min, 2 ea.   Heel  Raises  15 reps   with inversion for posterior tib strengthening on slant boar   Heel Raises Limitations  heel raise performed on airex pad with cues to push through big toe to prevent inversion of the ankle 2 x 10    Heel Walk (Round Trip)  2 x 25 ft    Toe Walk (Round Trip)  2 x 25ft   cues to avoid cross over gait         Balance Exercises - 12/22/17 1021      Balance Exercises: Standing   SLS  Eyes open;Foam/compliant surface;4 reps;30 secs          PT Short Term Goals - 12/22/17 1013      PT SHORT TERM GOAL #1   Title  Patient to be compliant with appropriate HEP, to be updated PRN     Time  3      Period  Weeks    Status  Achieved      PT SHORT TERM GOAL #2   Title  Patient to demonstrate ankle ROM as being symmetrical on all measured planes in order to improve gait mechanics and gross mobility     Time  3    Period  Weeks    Status  Achieved      PT SHORT TERM GOAL #3   Title  Patient to report pain as being no more than 3/10 at worst when standing or walking for at least 30 minutes in order to show improved QOL and mobility     Time  3    Period  Weeks    Status  Achieved        PT Long Term Goals - 12/22/17 1013      PT LONG TERM GOAL #1   Title  Patient to demonstrate MMT as having improved by at least one grade in all tested groups, and will score at least 4/5 for bilateral plantar flexor strength in order to assist in improving ankle stability and reducing pain     Time  6    Period  Weeks    Status  Achieved      PT LONG TERM GOAL #2   Title  Patient to be able to ambulate on even and uneven surfaces with consistent and quality heel strike and toe-off bilaterally in order to show improved gait mechanics and reduced stress on ankles     Time  6    Period  Weeks    Status  On-going      PT LONG TERM GOAL #3   Title  Patient to be able to maintain SLS for at least 15 seconds on solid surface/10 seconds on foam pad in order to show improved ankle  stability and balance     Time  6    Period  Weeks    Status  On-going      PT LONG TERM GOAL #4   Title  Patient to be able to wear high heels for no more than 2 hours with ankle pain no more than 2/10 and no ankle twists or falls in order to show improved ankle stability and 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 continued working on hip strength and progress of ankle control on unstable surfaces which she requires intermittent verbal cues for form. end of session she reported feeling fatigued but no pain.    Rehab Potential  Good    PT Treatment/Interventions  ADLs/Self Care Home Management;Biofeedback;Cryotherapy;Electrical Stimulation;Iontophoresis 66m/ml Dexamethasone;Moist Heat;Ultrasound;Gait training;Stair training;Functional mobility training;Therapeutic activities;Therapeutic exercise;Balance training;Neuromuscular re-education;Patient/family education;Manual techniques;Passive range of motion;Dry needling;Taping    PT Next Visit Plan  ankle mobility and strength, gross LE strengthening.  heel raises, BAPS board/ rocker board, balance training    PT Home Exercise Plan  Eval: hip ABD, ankle circles, ankle alphabet, gastroc stretch; 4 way ankle red theraband, standing heel raises' SLS, heel raise with inversion    Consulted and Agree with Plan of Care  Patient       Patient will benefit from skilled therapeutic intervention in order to improve  the following deficits and impairments:  Abnormal gait, Increased fascial restricitons, Pain, Decreased range of motion, Decreased strength, Decreased balance, Difficulty walking, Increased edema, Impaired flexibility  Visit Diagnosis: Pain in left ankle and joints of left foot  Pain in right ankle and joints of right foot  Difficulty in walking, not  elsewhere classified  Muscle weakness (generalized)     Problem List There are no active problems to display for this patient.  [facility] PT Assessment - 12/22/17 1010      AROM   Left Ankle Dorsiflexion  8    Left Ankle Plantar Flexion  50    Left Ankle Inversion  20    Left Ankle Eversion  18      Strength   Left Ankle Dorsiflexion  5/5    Left Ankle Plantar Flexion  5/5    Left Ankle Inversion  5/5    Left Ankle Eversion  5/5         PT, DPT, LAT, ATC  12/22/17  10:57 AM       [facility] [address] [place], [place], [address] Phone: [phone]   Fax:  [phone]  Name: Kimberly Mcclain MRN: [id number] Date of Birth: 04/08/1996   

## 2017-12-24 ENCOUNTER — Ambulatory Visit: Payer: 59 | Admitting: Physical Therapy

## 2017-12-24 ENCOUNTER — Encounter: Payer: Self-pay | Admitting: Physical Therapy

## 2017-12-24 DIAGNOSIS — R262 Difficulty in walking, not elsewhere classified: Secondary | ICD-10-CM

## 2017-12-24 DIAGNOSIS — M25572 Pain in left ankle and joints of left foot: Secondary | ICD-10-CM | POA: Diagnosis not present

## 2017-12-24 DIAGNOSIS — M25571 Pain in right ankle and joints of right foot: Secondary | ICD-10-CM

## 2017-12-24 DIAGNOSIS — M6281 Muscle weakness (generalized): Secondary | ICD-10-CM

## 2017-12-24 NOTE — Therapy (Signed)
[place], [place], [address] Phone: [phone]   Fax:  [phone]  Physical Therapy Treatment  Patient Details  Name: Kimberly Mcclain MRN: [id number] Date of Birth: 1995-08-13 Referring Provider: Almedia Balls    Encounter Date: 12/24/2017  PT End of Session - 12/24/17 1115    Visit Number  7    Number of Visits  13    Date for PT Re-Evaluation  01/13/18    Authorization Type  [place] [employer]     PT Start Time  1015    PT Stop Time  1100    PT Time Calculation (min)  45 min       Past Medical History:  Diagnosis Date  . Anxiety   . Breast mass     Past Surgical History:  Procedure Laterality Date  . LEG SURGERY Left 2014    There were no vitals filed for this visit.  Subjective Assessment - 12/24/17 1023    Subjective  Sore, we did harder exercises last time. Not pain.     Currently in Pain?  No/denies                       [facility] Adult PT Treatment/Exercise - 12/24/17 0001      Knee/Hip Exercises: Sidelying   Hip ABduction  20 reps   2#     Knee/Hip Exercises: Prone   Other Prone Exercises  quadruped on elbows , hip extension with knee flexed and extended x 10 each bilateral       Ankle Exercises: Aerobic   Elliptical  3 ramp L4 x 5 min      Ankle Exercises: Standing   Rocker Board  Other (comment)   DF/ PF x 1 min, inversion/ Eversion x 1 min, 2 ea.   Rebounder  2 x 10 red ball bilateral   added foam   Heel Walk (Round Trip)  2 x 25 ft    Toe Walk (Round Trip)  2 x 72ft   cues to avoid cross over gait   Other Standing Ankle Exercises  wall squats 2 x 15   2nd set hold 5 seconds at bottom     Ankle Exercises: Stretches   Soleus Stretch  2 reps;30 seconds   slant board   Gastroc Stretch  2 reps;30 seconds   slant board   Gastroc Stretch Limitations  slant board x 60 sec    Other Stretch  hamstring stretch                PT Short Term Goals -  12/22/17 1013      PT SHORT TERM GOAL #1   Title  Patient to be compliant with appropriate HEP, to be updated PRN     Time  3    Period  Weeks    Status  Achieved      PT SHORT TERM GOAL #2   Title  Patient to demonstrate ankle ROM as being symmetrical on all measured planes in order to improve gait mechanics and gross mobility     Time  3    Period  Weeks    Status  Achieved      PT SHORT TERM GOAL #3   Title  Patient to report pain as being no more than 3/10 at worst when standing or walking for at least 30 minutes in order to show improved QOL and mobility     Time  3    Period  Weeks    Status  Achieved        PT Long Term Goals - 12/22/17 1013      PT LONG TERM GOAL #1   Title  Patient to demonstrate MMT as having improved by at least one grade in all tested groups, and will score at least 4/5 for bilateral plantar flexor strength in order to assist in improving ankle stability and reducing pain     Time  6    Period  Weeks    Status  Achieved      PT LONG TERM GOAL #2   Title  Patient to be able to ambulate on even and uneven surfaces with consistent and quality heel strike and toe-off bilaterally in order to show improved gait mechanics and reduced stress on ankles     Time  6    Period  Weeks    Status  On-going      PT LONG TERM GOAL #3   Title  Patient to be able to maintain SLS for at least 15 seconds on solid surface/10 seconds on foam pad in order to show improved ankle stability and balance     Time  6    Period  Weeks    Status  On-going      PT LONG TERM GOAL #4   Title  Patient to be able to wear high heels for no more than 2 hours with ankle pain no more than 2/10 and no ankle twists or falls in order to show improved ankle stability and 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  Continued with anke stability with quick fatigue due to soreness today. Focused more hip strength today with fatigue. Declined ice.     PT Next Visit Plan  ankle mobility and strength, gross LE strengthening.  heel raises, BAPS board/ rocker board, balance training    PT Home Exercise Plan  Eval: hip ABD, ankle circles, ankle alphabet, gastroc stretch; 4 way ankle red theraband, standing heel raises' SLS, heel raise with inversion    Consulted and Agree with Plan of Care  Patient       Patient will benefit from skilled therapeutic intervention in order to improve the following deficits and impairments:  Abnormal gait, Increased fascial restricitons, Pain, Decreased range of motion, Decreased strength, Decreased balance, Difficulty walking, Increased edema, Impaired flexibility  Visit Diagnosis: Pain in left ankle and joints of left foot  Pain in right ankle and joints of right foot  Difficulty in walking, not elsewhere classified  Muscle weakness (generalized)     Problem List There are no active problems to display for this patient.   Dorene Ar, [place] 12/24/2017, 11:17 AM  [facility] [address] [place], [place], [address] Phone: [phone]   Fax:  [phone]  Name: Kimberly Mcclain MRN: [id number] Date of Birth: 11-05-1995

## 2017-12-29 ENCOUNTER — Encounter: Payer: Self-pay | Admitting: Physical Therapy

## 2017-12-29 ENCOUNTER — Ambulatory Visit: Payer: 59 | Admitting: Physical Therapy

## 2017-12-29 DIAGNOSIS — M25571 Pain in right ankle and joints of right foot: Secondary | ICD-10-CM

## 2017-12-29 DIAGNOSIS — M25572 Pain in left ankle and joints of left foot: Secondary | ICD-10-CM

## 2017-12-29 DIAGNOSIS — M6281 Muscle weakness (generalized): Secondary | ICD-10-CM | POA: Diagnosis not present

## 2017-12-29 DIAGNOSIS — R262 Difficulty in walking, not elsewhere classified: Secondary | ICD-10-CM | POA: Diagnosis not present

## 2017-12-29 NOTE — Therapy (Signed)
[place], [place], [address] Phone: [phone]   Fax:  [phone]  Physical Therapy Treatment  Patient Details  Name: Kimberly Mcclain MRN: [id number] Date of Birth: 07/14/1995 Referring Provider: Almedia Balls    Encounter Date: 12/29/2017  PT End of Session - 12/29/17 1021    Visit Number  8    Number of Visits  13    Date for PT Re-Evaluation  01/13/18    Authorization Type  [facility]     PT Start Time  1018    PT Stop Time  1056    PT Time Calculation (min)  38 min       Past Medical History:  Diagnosis Date  . Anxiety   . Breast mass     Past Surgical History:  Procedure Laterality Date  . LEG SURGERY Left 2014    There were no vitals filed for this visit.  Subjective Assessment - 12/29/17 1020    Subjective  Sore pain front and outside of ankle. I was out of town and did a lot more walking.     Currently in Pain?  Yes    Pain Score  2     Pain Location  Ankle    Pain Orientation  Left;Anterior;Lateral    Pain Descriptors / Indicators  Sore    Aggravating Factors   increased walking    Pain Relieving Factors  rest          [facility] PT Assessment - 12/29/17 0001      Observation/Other Assessments   Focus on Therapeutic Outcomes (FOTO)   30% limited improved from 65% limited                    [facility] Adult PT Treatment/Exercise - 12/29/17 0001      Knee/Hip Exercises: Supine   Single Leg Bridge  15 reps;Right;Left      Knee/Hip Exercises: Sidelying   Hip ABduction  20 reps   2#     Knee/Hip Exercises: Prone   Other Prone Exercises  quadruped on elbows , hip extension with knee flexed and extended x 15 each bilateral       Ankle Exercises: Aerobic   Elliptical  3 ramp L4 x 5 min      Ankle Exercises: Standing   Rocker Board  Other (comment)   DF/ PF x 1 min, inversion/ Eversion x 1 min, 2 ea.   Rebounder  2 x 10 red ball bilateral   added foam   Heel  Raises  15 reps   with inversion for posterior tib strengthening on slant boar   Heel Walk (Round Trip)  2 x 25 ft    Toe Walk (Round Trip)  2 x 67ft   cues to avoid cross over gait   Other Standing Ankle Exercises  wall squats 2 x 15   2nd set hold 5 seconds at bottom     Ankle Exercises: Stretches   Soleus Stretch  2 reps;30 seconds   slant board   Gastroc Stretch  2 reps;30 seconds   slant board   Other Stretch  mobs with mvmt green band at ankle with knee/ankle flexion facing forward and backward    Other Stretch  hamstring stretch       Ankle Exercises: Seated   Other Seated Ankle Exercises  stool scoot 49ft x 2  PT Short Term Goals - 12/22/17 1013      PT SHORT TERM GOAL #1   Title  Patient to be compliant with appropriate HEP, to be updated PRN     Time  3    Period  Weeks    Status  Achieved      PT SHORT TERM GOAL #2   Title  Patient to demonstrate ankle ROM as being symmetrical on all measured planes in order to improve gait mechanics and gross mobility     Time  3    Period  Weeks    Status  Achieved      PT SHORT TERM GOAL #3   Title  Patient to report pain as being no more than 3/10 at worst when standing or walking for at least 30 minutes in order to show improved QOL and mobility     Time  3    Period  Weeks    Status  Achieved        PT Long Term Goals - 12/22/17 1013      PT LONG TERM GOAL #1   Title  Patient to demonstrate MMT as having improved by at least one grade in all tested groups, and will score at least 4/5 for bilateral plantar flexor strength in order to assist in improving ankle stability and reducing pain     Time  6    Period  Weeks    Status  Achieved      PT LONG TERM GOAL #2   Title  Patient to be able to ambulate on even and uneven surfaces with consistent and quality heel strike and toe-off bilaterally in order to show improved gait mechanics and reduced stress on ankles     Time  6    Period  Weeks     Status  On-going      PT LONG TERM GOAL #3   Title  Patient to be able to maintain SLS for at least 15 seconds on solid surface/10 seconds on foam pad in order to show improved ankle stability and balance     Time  6    Period  Weeks    Status  On-going      PT LONG TERM GOAL #4   Title  Patient to be able to wear high heels for no more than 2 hours with ankle pain no more than 2/10 and no ankle twists or falls in order to show improved ankle stability and 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 Pain reached 3/10. FOTO score improved.     PT Next Visit Plan  ankle mobility and strength, gross LE strengthening.  heel raises, BAPS board/ rocker board, balance training    PT Home Exercise Plan  Eval: hip ABD, ankle circles, ankle alphabet, gastroc stretch; 4 way ankle red theraband, standing heel raises' SLS, heel raise with inversion    Consulted and Agree with Plan of Care  Patient       Patient will benefit from skilled therapeutic intervention in order to improve the following deficits and impairments:  Abnormal gait, Increased fascial restricitons, Pain,  Decreased range of motion, Decreased strength, Decreased balance, Difficulty walking, Increased edema, Impaired flexibility  Visit Diagnosis: Pain in left ankle and joints of left foot  Pain in right ankle and joints of right foot  Difficulty in walking, not elsewhere classified  Muscle weakness (generalized)     Problem List There are no active problems to display for this patient.   Dorene Ar, [place] 12/29/2017, 10:53 AM  [place], [place], [address] Phone: [phone]   Fax:  [phone]  Name: Nyiesha Beever MRN: [id number] Date of Birth: March 18, 1996

## 2017-12-31 ENCOUNTER — Ambulatory Visit: Payer: 59 | Admitting: Physical Therapy

## 2018-01-06 ENCOUNTER — Encounter: Payer: Self-pay | Admitting: Physical Therapy

## 2018-01-06 ENCOUNTER — Ambulatory Visit: Payer: 59 | Attending: Orthopedic Surgery | Admitting: Physical Therapy

## 2018-01-06 DIAGNOSIS — R262 Difficulty in walking, not elsewhere classified: Secondary | ICD-10-CM | POA: Diagnosis not present

## 2018-01-06 DIAGNOSIS — M25572 Pain in left ankle and joints of left foot: Secondary | ICD-10-CM | POA: Insufficient documentation

## 2018-01-06 DIAGNOSIS — M6281 Muscle weakness (generalized): Secondary | ICD-10-CM | POA: Insufficient documentation

## 2018-01-06 DIAGNOSIS — M25571 Pain in right ankle and joints of right foot: Secondary | ICD-10-CM | POA: Insufficient documentation

## 2018-01-06 NOTE — Therapy (Signed)
[place], [place], [address] Phone: [phone]   Fax:  [phone]  Physical Therapy Treatment  Patient Details  Name: Kimberly Mcclain MRN: [id number] Date of Birth: May 02, 1996 Referring Provider: Almedia Balls    Encounter Date: 01/06/2018  PT End of Session - 01/06/18 1010    Visit Number  9    Number of Visits  13    Date for PT Re-Evaluation  01/13/18    Authorization Type  [facility]     Authorization Time Period  12/02/17 to 01/13/18    PT Start Time  1015    PT Stop Time  1055    PT Time Calculation (min)  40 min    Activity Tolerance  Patient tolerated treatment well    Behavior During Therapy  [facility] for tasks assessed/performed       Past Medical History:  Diagnosis Date  . Anxiety   . Breast mass     Past Surgical History:  Procedure Laterality Date  . LEG SURGERY Left 2014    There were no vitals filed for this visit.  Subjective Assessment - 01/06/18 1012    Subjective  Getting on the elliptical and stating she is 0/10. I have been doing exercises twice a day but I slacked over the weekend for about 1 x  a day    How long can you sit comfortably?  no limits     How long can you stand comfortably?  no limits     How long can you walk comfortably?  pt took long walk for about 45 minutes    Patient Stated Goals  have both ankles be stronger, stop painful clicking in ankle     Currently in Pain?  No/denies    Pain Score  0-No pain    Pain Location  Ankle    Pain Orientation  Left    Pain Descriptors / Indicators  Sore         [facility] PT Assessment - 01/06/18 1029      Functional Tests   Functional tests  Single Leg Squat      Single Leg Squat   Comments  Right 21 sec on foam pad and left is 42 sec on foam pad.  Left injured side                   [facility] Adult PT Treatment/Exercise - 01/06/18 1029      Ambulation/Gait   Ambulation Distance (Feet)  500 Feet    Assistive device  None    Gait Pattern  Step-through pattern    Ambulation Surface  Outdoor   Pt tends to knee valgus on right .stronger on injured left   Pre-Gait Activities  curb stepping forward and back.  also side stepping with one foot on curb and one foot on parking lot on left and on right both 5 x 30 feet.     Gait Comments  Pt able to jog 1 x 200 feet . walking over unlevel surfaces out doors over grass inclined parking lot,      Knee/Hip Exercises: Supine   Single Leg Bridge  15 reps;Right;Left      Knee/Hip Exercises: Sidelying   Hip ABduction  20 reps   3#     Knee/Hip Exercises: Prone   Other Prone Exercises  quadruped on elbows , hip extension with knee flexed and extended x 15 each bilateral    with 3 lb weight  Ankle Exercises: Aerobic   Elliptical  3 ramp L4 x 6 min      Ankle Exercises: Standing   Rebounder  2 minutes limits of stability forward and side to side 4 minutes    Heel Raises  20 reps   with inversion for posterior tib strengthening on slant boar   Other Standing Ankle Exercises  deep squats x 15       Ankle Exercises: Stretches   Soleus Stretch  2 reps;30 seconds   slant board   Gastroc Stretch  2 reps;30 seconds   slant board   Other Stretch  hamstring stretch    bil 2 x 30sec            PT Education - 01/06/18 1015    Education Details  reviewed HEP and reinforced. tried jogging today without pain and walking outside on uneven surfaces    Person(s) Educated  Patient    Methods  Explanation;Demonstration;Tactile cues;Verbal cues    Comprehension  Verbalized understanding;Returned demonstration       PT Short Term Goals - 12/22/17 1013      PT SHORT TERM GOAL #1   Title  Patient to be compliant with appropriate HEP, to be updated PRN     Time  3    Period  Weeks    Status  Achieved      PT SHORT TERM GOAL #2   Title  Patient to demonstrate ankle ROM as being symmetrical on all measured planes in order to improve gait  mechanics and gross mobility     Time  3    Period  Weeks    Status  Achieved      PT SHORT TERM GOAL #3   Title  Patient to report pain as being no more than 3/10 at worst when standing or walking for at least 30 minutes in order to show improved QOL and mobility     Time  3    Period  Weeks    Status  Achieved        PT Long Term Goals - 01/06/18 1016      PT LONG TERM GOAL #1   Title  Patient to demonstrate MMT as having improved by at least one grade in all tested groups, and will score at least 4/5 for bilateral plantar flexor strength in order to assist in improving ankle stability and reducing pain     Baseline  achieved 2 weeks ago    Time  6    Period  Weeks    Status  Achieved      PT LONG TERM GOAL #2   Title  Patient to be able to ambulate on even and uneven surfaces with consistent and quality heel strike and toe-off bilaterally in order to show improved gait mechanics and reduced stress on ankles     Baseline  began out door uneven ambulation today    Time  6    Period  Weeks    Status  On-going      PT LONG TERM GOAL #3   Title  Patient to be able to maintain SLS for at least 15 seconds on solid surface/10 seconds on foam pad in order to show improved ankle stability and balance     Baseline  on Green ther ex pad right 21 sec and left( injured side 42 seconds with visible co contraction 01-06-18    Time  6    Period  Weeks  Status  Achieved      PT LONG TERM GOAL #4   Title  Patient to be able to wear high heels for no more than 2 hours with ankle pain no more than 2/10 and no ankle twists or falls in order to show improved ankle stability and 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  Pt did try on some high heel shoes at store and walked about 20 steps    Period  Weeks    Status  On-going            Plan - 01/06/18 1041    Clinical Impression Statement  Pt  with no ankle pain today and attempted jogging for 200 feet and walking on unlevel surfaces and inclines.  LTG # 3 achieved, Pt able to stand on green ther ex on right 21 sec and left 42 seconds with visible cocontraction especially on left side. Pt with no pain at end of RX today    Rehab Potential  Good    PT Frequency  2x / week    PT Duration  6 weeks    PT Treatment/Interventions  ADLs/Self Care Home Management;Biofeedback;Cryotherapy;Electrical Stimulation;Iontophoresis 4mg /ml Dexamethasone;Moist Heat;Ultrasound;Gait training;Stair training;Functional mobility training;Therapeutic activities;Therapeutic exercise;Balance training;Neuromuscular re-education;Patient/family education;Manual techniques;Passive range of motion;Dry needling;Taping    PT Next Visit Plan  ankle mobility and strength, gross LE strengthening.  heel raises, BAPS board/ rocker board, balance training ,  Possible DC next week     PT Home Exercise Plan   ankle circles, ankle alphabet, gastroc stretch; 4 way ankle red theraband, standing heel raises' SLS, heel raise with inversion    Consulted and Agree with Plan of Care  Patient       Patient will benefit from skilled therapeutic intervention in order to improve the following deficits and impairments:  Abnormal gait, Increased fascial restricitons, Pain, Decreased range of motion, Decreased strength, Decreased balance, Difficulty walking, Increased edema, Impaired flexibility  Visit Diagnosis: Pain in left ankle and joints of left foot  Pain in right ankle and joints of right foot  Difficulty in walking, not elsewhere classified  Muscle weakness (generalized)     Problem List There are no active problems to display for this patient.  Voncille Lo, PT Certified Exercise Expert for the Aging Adult  01/06/18 10:55 AM Phone: [phone] Fax: [phone]   [facility] [address] [place], [place],  [address] Phone: [phone]   Fax:  [phone]  Name: Kimberly Mcclain MRN: [id number] Date of Birth: 1995-06-05

## 2018-01-08 ENCOUNTER — Ambulatory Visit: Payer: 59 | Admitting: Physical Therapy

## 2018-01-08 ENCOUNTER — Encounter: Payer: Self-pay | Admitting: Physical Therapy

## 2018-01-08 DIAGNOSIS — M25571 Pain in right ankle and joints of right foot: Secondary | ICD-10-CM

## 2018-01-08 DIAGNOSIS — R262 Difficulty in walking, not elsewhere classified: Secondary | ICD-10-CM | POA: Diagnosis not present

## 2018-01-08 DIAGNOSIS — M6281 Muscle weakness (generalized): Secondary | ICD-10-CM | POA: Diagnosis not present

## 2018-01-08 DIAGNOSIS — M25572 Pain in left ankle and joints of left foot: Secondary | ICD-10-CM

## 2018-01-08 NOTE — Therapy (Signed)
[place], [place], [address] Phone: [phone]   Fax:  [phone]  Physical Therapy Treatment  Patient Details  Name: Kimberly Mcclain MRN: [id number] Date of Birth: 20-Oct-1995 Referring Provider: Almedia Balls    Encounter Date: 01/08/2018  PT End of Session - 01/08/18 1101    Visit Number  10    Number of Visits  13    Date for PT Re-Evaluation  01/13/18    PT Start Time  1016    PT Stop Time  1059    PT Time Calculation (min)  43 min    Activity Tolerance  Patient tolerated treatment well    Behavior During Therapy  [facility] for tasks assessed/performed       Past Medical History:  Diagnosis Date  . Anxiety   . Breast mass     Past Surgical History:  Procedure Laterality Date  . LEG SURGERY Left 2014    There were no vitals filed for this visit.  Subjective Assessment - 01/08/18 1019    Subjective  "things seem to be going pretty good and haven't pain for the last 2 weeks. I tried on high heels the other day and I was alittle sore with the foot going out"     Currently in Pain?  No/denies                       [facility] Adult PT Treatment/Exercise - 01/08/18 0001      Exercises   Exercises  Ankle;Knee/Hip      Knee/Hip Exercises: Plyometrics   Other Plyometric Exercises  alternating toe taps around bosu 1 x 30 sec CW, 1 x 30 sec CCW      Knee/Hip Exercises: Standing   Forward Lunges  2 sets;20 reps;Both   touching on Bosu ball   Forward Lunges Limitations  mild difficulty with balance demonstrating increased postural sway    Other Standing Knee Exercises  sustained squat with lateral band walks around table 2 x ea.       Ankle Exercises: Stretches   Soleus Stretch  2 reps;30 seconds   slant board   Gastroc Stretch  2 reps;30 seconds   slant board     Ankle Exercises: Aerobic   Stepper  L2 x 4 min       Ankle Exercises: Standing   Rocker Board Limitations  2 x 20 with  eversion focusing on keeping contralateral hip hiked     Heel Walk (Round Trip)  1 x 80 ft    Toe Walk (Round Trip)  1 x 80 ft    Other Standing Ankle Exercises  inversion walking/ everision walking 1 x 80 ft ea.   to promote control in position              PT Short Term Goals - 12/22/17 1013      PT SHORT TERM GOAL #1   Title  Patient to be compliant with appropriate HEP, to be updated PRN     Time  3    Period  Weeks    Status  Achieved      PT SHORT TERM GOAL #2   Title  Patient to demonstrate ankle ROM as being symmetrical on all measured planes in order to improve gait mechanics and gross mobility     Time  3    Period  Weeks    Status  Achieved      PT SHORT  TERM GOAL #3   Title  Patient to report pain as being no more than 3/10 at worst when standing or walking for at least 30 minutes in order to show improved QOL and mobility     Time  3    Period  Weeks    Status  Achieved        PT Long Term Goals - 01/06/18 1016      PT LONG TERM GOAL #1   Title  Patient to demonstrate MMT as having improved by at least one grade in all tested groups, and will score at least 4/5 for bilateral plantar flexor strength in order to assist in improving ankle stability and reducing pain     Baseline  achieved 2 weeks ago    Time  6    Period  Weeks    Status  Achieved      PT LONG TERM GOAL #2   Title  Patient to be able to ambulate on even and uneven surfaces with consistent and quality heel strike and toe-off bilaterally in order to show improved gait mechanics and reduced stress on ankles     Baseline  began out door uneven ambulation today    Time  6    Period  Weeks    Status  On-going      PT LONG TERM GOAL #3   Title  Patient to be able to maintain SLS for at least 15 seconds on solid surface/10 seconds on foam pad in order to show improved ankle stability and balance     Baseline  on Green ther ex pad right 21 sec and left( injured side 42 seconds with visible co  contraction 01-06-18    Time  6    Period  Weeks    Status  Achieved      PT LONG TERM GOAL #4   Title  Patient to be able to wear high heels for no more than 2 hours with ankle pain no more than 2/10 and no ankle twists or falls in order to show improved ankle stability and 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  Pt did try on some high heel shoes at store and walked about 20 steps    Period  Weeks    Status  On-going            Plan - 01/08/18 1101    Clinical Impression Statement  pt continues to report no pain. continued working on ankle stability with walking in multiple positions and hip strengtheing which she performed well. continued dynamic jumping going around bosu to promote lateral movement. end of session she reported no pain.     PT Next Visit Plan  last 2 visits, finalizing HEP, continue dynamic exericse and progress strengthening and balance promoting endurance.     PT Home Exercise Plan   ankle circles, ankle alphabet, gastroc stretch; 4 way ankle red theraband, standing heel raises' SLS, heel raise with inversion    Consulted and Agree with Plan of Care  Patient       Patient will benefit from skilled therapeutic intervention in order to improve the following deficits and impairments:     Visit Diagnosis: Pain in left ankle and joints of left foot  Pain in right ankle and joints of right foot  Difficulty in walking, not elsewhere classified  Muscle weakness (  generalized)     Problem List There are no active problems to display for this patient.  Starr Lake PT, DPT, LAT, ATC  01/08/18  11:04 AM      [facility] [address] [place], [place], [address] Phone: [phone]   Fax:  [phone]  Name: Kimberly Mcclain MRN: [id number] Date of Birth: Jun 02, 1995

## 2018-01-12 ENCOUNTER — Encounter: Payer: Self-pay | Admitting: Physical Therapy

## 2018-01-12 ENCOUNTER — Ambulatory Visit: Payer: 59 | Admitting: Physical Therapy

## 2018-01-12 DIAGNOSIS — M6281 Muscle weakness (generalized): Secondary | ICD-10-CM | POA: Diagnosis not present

## 2018-01-12 DIAGNOSIS — M25572 Pain in left ankle and joints of left foot: Secondary | ICD-10-CM | POA: Diagnosis not present

## 2018-01-12 DIAGNOSIS — R262 Difficulty in walking, not elsewhere classified: Secondary | ICD-10-CM

## 2018-01-12 DIAGNOSIS — M25571 Pain in right ankle and joints of right foot: Secondary | ICD-10-CM | POA: Diagnosis not present

## 2018-01-12 NOTE — Therapy (Addendum)
[place], [place], [address] Phone: [phone]   Fax:  [phone]  Physical Therapy Treatment / Discharge Summary  Patient Details  Name: Katheren Jimmerson MRN: [id number] Date of Birth: 12-23-1995 Referring Provider: Almedia Balls    Encounter Date: 01/12/2018  PT End of Session - 01/12/18 1024    Visit Number  11    Number of Visits  13    Date for PT Re-Evaluation  01/13/18    Authorization Type  [facility]     Authorization Time Period  12/02/17 to 01/13/18    PT Start Time  1016    PT Stop Time  1100    PT Time Calculation (min)  44 min       Past Medical History:  Diagnosis Date  . Anxiety   . Breast mass     Past Surgical History:  Procedure Laterality Date  . LEG SURGERY Left 2014    There were no vitals filed for this visit.  Subjective Assessment - 01/12/18 1022    Subjective  Had pain after 1 hour [place]. No pain now.     Currently in Pain?  No/denies         [facility] PT Assessment - 01/12/18 0001      Observation/Other Assessments   Focus on Therapeutic Outcomes (FOTO)   11% limited improved from 65%       Strength   Right Hip ABduction  4+/5    Left Hip ABduction  4+/5    Right Knee Flexion  5/5    Left Knee Flexion  4+/5    Left Ankle Dorsiflexion  5/5    Left Ankle Plantar Flexion  5/5   25/25 heel raises   Left Ankle Inversion  4+/5    Left Ankle Eversion  4+/5                   [facility] Adult PT Treatment/Exercise - 01/12/18 0001      Knee/Hip Exercises: Standing   Forward Lunges Limitations  forward dynamic lunges 8 x 2     Other Standing Knee Exercises  sustained squat with lateral band walks around table 2 x ea.       Knee/Hip Exercises: Prone   Other Prone Exercises  quadruped on elbows , hip extension with knee flexed and extended x 15 each bilateral    with 3 lb weight     Ankle Exercises: Aerobic   Elliptical  3 ramp L4 x  6 min      Ankle Exercises: Standing   Heel Raises  20 reps    Heel Raises Limitations  on foam pad     Heel Walk (Round Trip)  1 x 80 ft    Toe Walk (Round Trip)  1 x 80 ft      Ankle Exercises: Stretches   Soleus Stretch  2 reps;30 seconds   slant board   Gastroc Stretch  2 reps;30 seconds   slant board              PT Short Term Goals - 12/22/17 1013      PT SHORT TERM GOAL #1   Title  Patient to be compliant with appropriate HEP, to be updated PRN     Time  3    Period  Weeks    Status  Achieved      PT SHORT TERM GOAL #2   Title  Patient to demonstrate ankle ROM as being symmetrical on all measured planes in order to improve gait mechanics and gross mobility     Time  3    Period  Weeks    Status  Achieved      PT SHORT TERM GOAL #3   Title  Patient to report pain as being no more than 3/10 at worst when standing or walking for at least 30 minutes in order to show improved QOL and mobility     Time  3    Period  Weeks    Status  Achieved        PT Long Term Goals - 01/12/18 1049      PT LONG TERM GOAL #1   Title  Patient to demonstrate MMT as having improved by at least one grade in all tested groups, and will score at least 4/5 for bilateral plantar flexor strength in order to assist in improving ankle stability and reducing pain     Time  6    Period  Weeks    Status  Achieved      PT LONG TERM GOAL #2   Title  Patient to be able to ambulate on even and uneven surfaces with consistent and quality heel strike and toe-off bilaterally in order to show improved gait mechanics and reduced stress on ankles     Baseline  no difficulty    Time  6    Period  Weeks    Status  Achieved      PT LONG TERM GOAL #3   Title  Patient to be able to maintain SLS for at least 15 seconds on solid surface/10 seconds on foam pad in order to show improved ankle stability and balance     Baseline  on Green ther ex pad right 21 sec and left( injured side 42 seconds with  visible co contraction 01-06-18    Time  6    Period  Weeks    Status  Achieved      PT LONG TERM GOAL #4   Title  Patient to be able to wear high heels for no more than 2 hours with ankle pain no more than 2/10 and no ankle twists or falls in order to show improved ankle stability and 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  Pt did try on some high heel shoes at store and walked about 20 steps    Time  6    Period  Weeks    Status  Deferred            Plan - 01/12/18 1045    Clinical Impression Statement  Pt reports low levels of pain after prolonged walking. Otherwise she is pain free. She is walking on unlevel surfaces including grass and gravel without increased pain. She has worn heeld for a short period of time without difficulty. She will resume high heeled dancing this month. LTGs mostly met and she will continue with HEP. Discharge today.     PT Next Visit Plan  discharge today     PT Home Exercise Plan   ankle circles, ankle alphabet, gastroc stretch; 4 way ankle red theraband, standing heel raises' SLS, heel raise with inversion    Consulted and Agree with Plan of Care  Patient       Patient will benefit from skilled therapeutic  intervention in order to improve the following deficits and impairments:  Abnormal gait, Increased fascial restricitons, Pain, Decreased range of motion, Decreased strength, Decreased balance, Difficulty walking, Increased edema, Impaired flexibility  Visit Diagnosis: Pain in left ankle and joints of left foot  Pain in right ankle and joints of right foot  Difficulty in walking, not elsewhere classified  Muscle weakness (generalized)     Problem List There are no active problems to display for this patient.   Dorene Ar, [place] 01/12/2018, 12:44 PM  [place], [place], [address] Phone: [phone]   Fax:  [phone]  Name: Feige Lowdermilk MRN: [id number] Date of Birth: [birth year]        PHYSICAL THERAPY DISCHARGE SUMMARY  Visits from Start of Care: 11  Current functional level related to goals / functional outcomes: See goals   Remaining deficits: See assessment   Education / Equipment: HEP, theraband, posture  Plan: Patient agrees to discharge.  Patient goals were met. Patient is being discharged due to meeting the stated rehab goals.  ?????          Kristoffer Leamon PT, DPT, LAT, ATC  01/12/18  1:13 PM

## 2018-01-13 DIAGNOSIS — M25572 Pain in left ankle and joints of left foot: Secondary | ICD-10-CM | POA: Diagnosis not present

## 2018-01-15 ENCOUNTER — Ambulatory Visit: Payer: 59 | Admitting: Physical Therapy

## 2018-01-27 ENCOUNTER — Ambulatory Visit
Admission: RE | Admit: 2018-01-27 | Discharge: 2018-01-27 | Disposition: A | Discharge status: Home / Self Care | Payer: 59 | Source: Ambulatory Visit | Attending: Family Medicine | Admitting: Family Medicine

## 2018-01-27 DIAGNOSIS — N6489 Other specified disorders of breast: Secondary | ICD-10-CM | POA: Diagnosis not present

## 2018-01-27 DIAGNOSIS — N6310 Unspecified lump in the right breast, unspecified quadrant: Secondary | ICD-10-CM

## 2018-01-27 DIAGNOSIS — D241 Benign neoplasm of right breast: Secondary | ICD-10-CM | POA: Diagnosis not present

## 2018-01-27 MED FILL — ESCITALOPRAM 10 MG TABLET: 10 | 90 days supply | Qty: 135 | Fill #0

## 2018-03-04 MED FILL — OMEPRAZOLE 40 MG CPDR: 40 | 90 days supply | Qty: 90 | Fill #0

## 2018-03-06 DIAGNOSIS — L700 Acne vulgaris: Secondary | ICD-10-CM | POA: Diagnosis not present

## 2018-03-10 ENCOUNTER — Encounter: Payer: Self-pay | Admitting: Obstetrics & Gynecology

## 2018-03-10 ENCOUNTER — Ambulatory Visit (INDEPENDENT_AMBULATORY_CARE_PROVIDER_SITE_OTHER): Payer: 59 | Admitting: Obstetrics & Gynecology

## 2018-03-10 VITALS — BP 120/84 | HR 61 | Ht 66.0 in | Wt 163.0 lb

## 2018-03-10 DIAGNOSIS — L700 Acne vulgaris: Secondary | ICD-10-CM

## 2018-03-10 DIAGNOSIS — Z113 Encounter for screening for infections with a predominantly sexual mode of transmission: Secondary | ICD-10-CM | POA: Diagnosis not present

## 2018-03-10 DIAGNOSIS — Z01419 Encounter for gynecological examination (general) (routine) without abnormal findings: Secondary | ICD-10-CM

## 2018-03-10 DIAGNOSIS — N943 Premenstrual tension syndrome: Secondary | ICD-10-CM

## 2018-03-10 DIAGNOSIS — Z124 Encounter for screening for malignant neoplasm of cervix: Secondary | ICD-10-CM | POA: Diagnosis not present

## 2018-03-10 DIAGNOSIS — N941 Unspecified dyspareunia: Secondary | ICD-10-CM

## 2018-03-10 MED ORDER — LEVONORGEST-ETH ESTRADIOL-IRON 0.1-20 MG-MCG(21) PO TABS: 1.0000 | ORAL_TABLET | Freq: Every day | ORAL | 0 refills | Status: DC

## 2018-03-10 NOTE — Patient Instructions (Signed)
Return to clinic for any scheduled appointments or for any gynecologic concerns as needed.   

## 2018-03-10 NOTE — Progress Notes (Signed)
GYNECOLOGY ANNUAL PREVENTATIVE CARE ENCOUNTER NOTE  Subjective:   Kimberly Mcclain is a 22 y.o. G0P0000 female here for a routine annual gynecologic exam.  Current complaints: increased menstrual flow, pain with intercourse, pain before periods and acne since cessation of OCPs three years ago. Stopped the pills due to mood effects.  No other symptoms.   Denies current  abnormal vaginal bleeding, discharge, pelvic pain, other problems with intercourse or other gynecologic concerns.    Gynecologic History Patient's last menstrual period was 02/22/2018. Contraception: condoms Last Pap: 2017. Results were: normal  Has stable right breast fibroadenoma  Obstetric History OB History  Gravida Para Term Preterm AB Living  0 0 0 0 0 0  SAB TAB Ectopic Multiple Live Births  0 0 0 0      Past Medical History:  Diagnosis Date  . Anxiety   . Fibroadenoma of right breast     Past Surgical History:  Procedure Laterality Date  . LEG SURGERY Left 2014    Current Outpatient Medications on File Prior to Visit  Medication Sig Dispense Refill  . escitalopram (LEXAPRO) 10 MG tablet Take 10 mg by mouth daily.    Marland Kitchen omeprazole (PRILOSEC) 20 MG capsule Take 20 mg by mouth daily.     No current facility-administered medications on file prior to visit.     No Known Allergies  Social History:  reports that she has never smoked. She has never used smokeless tobacco. She reports that she has current or past drug history. Drug: Marijuana. She reports that she does not drink alcohol.  Family History  Problem Relation Age of Onset  . Diabetes Maternal Grandmother     The following portions of the patient's history were reviewed and updated as appropriate: allergies, current medications, past family history, past medical history, past social history, past surgical history and problem list.  Review of Systems Pertinent items noted in HPI and remainder of comprehensive ROS otherwise negative.     Objective:  BP 120/84   Pulse 61   Ht 5\' 6"  (1.676 m)   Wt 163 lb (73.9 kg)   LMP 02/22/2018   BMI 26.31 kg/m  CONSTITUTIONAL: Well-developed, well-nourished female in no acute distress.  HENT:  Normocephalic, atraumatic, External right and left ear normal. Oropharynx is clear and moist EYES: Conjunctivae and EOM are normal. Pupils are equal, round, and reactive to light. No scleral icterus.  NECK: Normal range of motion, supple, no masses.  Normal thyroid.  SKIN: Skin is warm and dry. No rash noted. Not diaphoretic. No erythema. No pallor. MUSCULOSKELETAL: Normal range of motion. No tenderness.  No cyanosis, clubbing, or edema.  2+ distal pulses. NEUROLOGIC: Alert and oriented to person, place, and time. Normal reflexes, muscle tone coordination. No cranial nerve deficit noted. PSYCHIATRIC: Normal mood and affect. Normal behavior. Normal judgment and thought content. CARDIOVASCULAR: Normal heart rate noted, regular rhythm RESPIRATORY: Clear to auscultation bilaterally. Effort and breath sounds normal, no problems with respiration noted. BREASTS: Symmetric in size. Stable 1 cm R breast fibroadenoma in upper inner quadrant of breast, around 1 o'clock position about 4 cm from nipple.  No other masses, skin changes, nipple drainage, or lymphadenopathy bilaterally. ABDOMEN: Soft, normal bowel sounds, no distention noted.  No tenderness, rebound or guarding.  PELVIC: Normal appearing external genitalia; normal appearing vaginal mucosa and cervix.  No abnormal discharge noted.  Pap smear obtained.  Normal uterine size, no other palpable masses,  Mild cervical tenderness on palpation (corresponds to dyspareunia  as per patient), no adnexal tenderness.    Assessment and Plan:  1. Premenstrual tension 2. Dyspareunia in female 3. Acne vulgaris Concerned about possible endometriosis.  Will re-try OCPs for now, this may help all her symptoms.  Offered ultrasound for evaluation, she declines for now,  will reconsider if symptoms persist. Will try 20 mcg pill, she reports having a lot of breakthrough bleeding with lowest dose OCPs.  She is on Lexapro for anxiety, hopefully this will help with any mood effects.  If pain persists, will consider Orilissa.  Reevaluate in 2 months. - Levonorgest-Eth Estrad-Fe Bisg (BALCOLTRA) 0.1-20 MG-MCG(21) TABS; Take 1 tablet by mouth daily.  Dispense: 84 tablet; Refill: 0  4. Well woman exam with routine gynecological exam - HIV Antibody (routine testing w rflx) - Hepatitis B surface antigen - RPR - Hepatitis C antibody - Cervicovaginal ancillary only( [place]) - Cytology - PAP Will follow up results of pap smear and other results and manage accordingly. Routine preventative health maintenance measures emphasized. Please refer to After Visit Summary for other counseling recommendations.    Verita Schneiders, MD, [place] for [employer], [place]

## 2018-03-10 NOTE — Progress Notes (Signed)
Pt states she is having increase in cramping before cycles, heavier cycles and notice in facial hair and increase in acne. Pt does request STD screening today.

## 2018-03-11 LAB — CYTOLOGY - PAP: Diagnosis: NEGATIVE

## 2018-03-11 LAB — CERVICOVAGINAL ANCILLARY ONLY
CHLAMYDIA, DNA PROBE: NEGATIVE
NEISSERIA GONORRHEA: NEGATIVE
Trichomonas: NEGATIVE

## 2018-03-12 LAB — HEPATITIS B SURFACE ANTIGEN: HEP B S AG: NEGATIVE

## 2018-03-12 LAB — HIV ANTIBODY (ROUTINE TESTING W REFLEX): HIV Screen 4th Generation wRfx: NONREACTIVE

## 2018-03-12 LAB — HEPATITIS C ANTIBODY: Hep C Virus Ab: <0.1 s/co ratio (ref 0.0–0.9)

## 2018-03-12 LAB — RPR: RPR Ser Ql: NONREACTIVE

## 2018-04-22 DIAGNOSIS — M25511 Pain in right shoulder: Secondary | ICD-10-CM | POA: Diagnosis not present

## 2018-05-29 DIAGNOSIS — R195 Other fecal abnormalities: Secondary | ICD-10-CM | POA: Diagnosis not present

## 2018-05-29 DIAGNOSIS — R0982 Postnasal drip: Secondary | ICD-10-CM | POA: Diagnosis not present

## 2018-06-10 DIAGNOSIS — R1084 Generalized abdominal pain: Secondary | ICD-10-CM | POA: Diagnosis not present

## 2018-06-10 DIAGNOSIS — K219 Gastro-esophageal reflux disease without esophagitis: Secondary | ICD-10-CM | POA: Diagnosis not present

## 2018-06-10 DIAGNOSIS — F1290 Cannabis use, unspecified, uncomplicated: Secondary | ICD-10-CM | POA: Diagnosis not present

## 2018-06-10 DIAGNOSIS — R140 Abdominal distension (gaseous): Secondary | ICD-10-CM | POA: Diagnosis not present

## 2018-06-10 DIAGNOSIS — R110 Nausea: Secondary | ICD-10-CM | POA: Diagnosis not present

## 2018-06-11 DIAGNOSIS — F1290 Cannabis use, unspecified, uncomplicated: Secondary | ICD-10-CM | POA: Diagnosis not present

## 2018-06-11 DIAGNOSIS — Z7289 Other problems related to lifestyle: Secondary | ICD-10-CM | POA: Diagnosis not present

## 2018-06-11 DIAGNOSIS — K1370 Unspecified lesions of oral mucosa: Secondary | ICD-10-CM | POA: Diagnosis not present

## 2018-06-11 DIAGNOSIS — Z9089 Acquired absence of other organs: Secondary | ICD-10-CM | POA: Diagnosis not present

## 2018-06-11 DIAGNOSIS — H938X3 Other specified disorders of ear, bilateral: Secondary | ICD-10-CM | POA: Diagnosis not present

## 2018-07-03 DIAGNOSIS — R1084 Generalized abdominal pain: Secondary | ICD-10-CM | POA: Diagnosis not present

## 2018-07-03 DIAGNOSIS — R140 Abdominal distension (gaseous): Secondary | ICD-10-CM | POA: Diagnosis not present

## 2018-07-03 DIAGNOSIS — R12 Heartburn: Secondary | ICD-10-CM | POA: Diagnosis not present

## 2018-07-03 DIAGNOSIS — K2980 Duodenitis without bleeding: Secondary | ICD-10-CM | POA: Diagnosis not present

## 2018-07-03 DIAGNOSIS — K2930 Chronic superficial gastritis without bleeding: Secondary | ICD-10-CM | POA: Diagnosis not present

## 2018-07-03 DIAGNOSIS — R110 Nausea: Secondary | ICD-10-CM | POA: Diagnosis not present

## 2018-07-07 DIAGNOSIS — F3162 Bipolar disorder, current episode mixed, moderate: Secondary | ICD-10-CM | POA: Diagnosis not present

## 2018-07-07 MED FILL — GABAPENTIN 300 MG CAPSULE: 300 | 30 days supply | Qty: 30 | Fill #0

## 2018-07-07 MED FILL — ARIPIPRAZOLE 10 MG TABS: 10 | 33 days supply | Qty: 30 | Fill #0

## 2018-07-10 DIAGNOSIS — F314 Bipolar disorder, current episode depressed, severe, without psychotic features: Secondary | ICD-10-CM | POA: Diagnosis not present

## 2018-07-10 DIAGNOSIS — F411 Generalized anxiety disorder: Secondary | ICD-10-CM | POA: Diagnosis not present

## 2018-07-10 MED FILL — traZODone HCL 50 MG TABS: 50 | 30 days supply | Qty: 30 | Fill #0

## 2018-07-30 ENCOUNTER — Other Ambulatory Visit: Payer: Self-pay

## 2018-07-30 ENCOUNTER — Encounter: Payer: Self-pay | Admitting: Internal Medicine

## 2018-07-30 ENCOUNTER — Ambulatory Visit: Payer: 59 | Admitting: Internal Medicine

## 2018-07-30 VITALS — BP 102/62 | HR 56 | Temp 98.1°F | Ht 66.0 in | Wt 170.4 lb

## 2018-07-30 DIAGNOSIS — R635 Abnormal weight gain: Secondary | ICD-10-CM | POA: Diagnosis not present

## 2018-07-30 DIAGNOSIS — K580 Irritable bowel syndrome with diarrhea: Secondary | ICD-10-CM | POA: Diagnosis not present

## 2018-07-30 DIAGNOSIS — F319 Bipolar disorder, unspecified: Secondary | ICD-10-CM | POA: Insufficient documentation

## 2018-07-30 DIAGNOSIS — D241 Benign neoplasm of right breast: Secondary | ICD-10-CM | POA: Diagnosis not present

## 2018-07-30 MED ORDER — METFORMIN HCL 500 MG PO TABS: 500.0000 mg | ORAL_TABLET | Freq: Every day | ORAL | 3 refills | Status: AC

## 2018-07-30 NOTE — Progress Notes (Signed)
Pre visit review using our clinic review tool, if applicable. No additional management support is needed unless otherwise documented below in the visit note. 

## 2018-07-30 NOTE — Patient Instructions (Signed)
Probiotics Align probiotics   exercise 30-45 minutes daily  Monitor your food choices   Cut metformin in 1/2 250 mg daily if you cant tolerate 500 mg daily   Low-FODMAP Eating Plan  FODMAPs (fermentable oligosaccharides, disaccharides, monosaccharides, and polyols) are sugars that are hard for some people to digest. A low-FODMAP eating plan may help some people who have bowel (intestinal) diseases to manage their symptoms. This meal plan can be complicated to follow. Work with a diet and nutrition specialist (dietitian) to make a low-FODMAP eating plan that is right for you. A dietitian can make sure that you get enough nutrition from this diet. What are tips for following this plan? Reading food labels  Check labels for hidden FODMAPs such as: ? High-fructose syrup. ? Honey. ? Agave. ? Natural fruit flavors. ? Onion or garlic powder.  Choose low-FODMAP foods that contain 3-4 grams of fiber per serving.  Check food labels for serving sizes. Eat only one serving at a time to make sure FODMAP levels stay low. Meal planning  Follow a low-FODMAP eating plan for up to 6 weeks, or as told by your health care provider or dietitian.  To follow the eating plan: 1. Eliminate high-FODMAP foods from your diet completely. 2. Gradually reintroduce high-FODMAP foods into your diet one at a time. Most people should wait a few days after introducing one high-FODMAP food before they introduce the next high-FODMAP food. Your dietitian can recommend how quickly you may reintroduce foods. 3. Keep a daily record of what you eat and drink, and make note of any symptoms that you have after eating. 4. Review your daily record with a dietitian regularly. Your dietitian can help you identify which foods you can eat and which foods you should avoid. General tips  Drink enough fluid each day to keep your urine pale yellow.  Avoid processed foods. These often have added sugar and may be high in FODMAPs.   Avoid most dairy products, whole grains, and sweeteners.  Work with a dietitian to make sure you get enough fiber in your diet. Recommended foods Grains  Gluten-free grains, such as rice, oats, buckwheat, quinoa, corn, polenta, and millet. Gluten-free pasta, bread, or cereal. Rice noodles. Corn tortillas. Vegetables  Eggplant, zucchini, cucumber, peppers, green beans, Brussels sprouts, bean sprouts, lettuce, arugula, kale, Swiss chard, spinach, collard greens, bok choy, summer squash, potato, and tomato. Limited amounts of corn, carrot, and sweet potato. Green parts of scallions. Fruits  Bananas, oranges, lemons, limes, blueberries, raspberries, strawberries, grapes, cantaloupe, honeydew melon, kiwi, papaya, passion fruit, and pineapple. Limited amounts of dried cranberries, banana chips, and shredded coconut. Dairy  Lactose-free milk, yogurt, and kefir. Lactose-free cottage cheese and ice cream. Non-dairy milks, such as almond, coconut, hemp, and rice milk. Yogurts made of non-dairy milks. Limited amounts of goat cheese, brie, mozzarella, parmesan, swiss, and other hard cheeses. Meats and other protein foods  Unseasoned beef, pork, poultry, or fish. Eggs. Berniece Salines. Tofu (firm) and tempeh. Limited amounts of nuts and seeds, such as almonds, walnuts, [place] nuts, pecans, peanuts, pumpkin seeds, chia seeds, and sunflower seeds. Fats and oils  Butter-free spreads. Vegetable oils, such as olive, canola, and sunflower oil. Seasoning and other foods  Artificial sweeteners with names that do not end in "ol" such as aspartame, saccharine, and stevia. Maple syrup, white table sugar, raw sugar, brown sugar, and molasses. Fresh basil, coriander, parsley, rosemary, and thyme. Beverages  Water and mineral water. Sugar-sweetened soft drinks. Small amounts of orange juice or cranberry  juice. Black and green tea. Most dry wines. Coffee. This may not be a complete list of low-FODMAP foods. Talk with your  dietitian for more information. Foods to avoid Grains  Wheat, including kamut, durum, and semolina. Barley and bulgur. Couscous. Wheat-based cereals. Wheat noodles, bread, crackers, and pastries. Vegetables  Chicory root, artichoke, asparagus, cabbage, snow peas, sugar snap peas, mushrooms, and cauliflower. Onions, garlic, leeks, and the white part of scallions. Fruits  Fresh, dried, and juiced forms of apple, pear, watermelon, peach, plum, cherries, apricots, blackberries, boysenberries, figs, nectarines, and mango. Avocado. Dairy  Milk, yogurt, ice cream, and soft cheese. Cream and sour cream. Milk-based sauces. Custard. Meats and other protein foods  Fried or fatty meat. Sausage. Cashews and pistachios. Soybeans, baked beans, black beans, chickpeas, kidney beans, fava beans, navy beans, lentils, and split peas. Seasoning and other foods  Any sugar-free gum or candy. Foods that contain artificial sweeteners such as sorbitol, mannitol, isomalt, or xylitol. Foods that contain honey, high-fructose corn syrup, or agave. Bouillon, vegetable stock, beef stock, and chicken stock. Garlic and onion powder. Condiments made with onion, such as hummus, chutney, pickles, relish, salad dressing, and salsa. Tomato paste. Beverages  Chicory-based drinks. Coffee substitutes. Chamomile tea. Fennel tea. Sweet or fortified wines such as port or sherry. Diet soft drinks made with isomalt, mannitol, maltitol, sorbitol, or xylitol. Apple, pear, and mango juice. Juices with high-fructose corn syrup. This may not be a complete list of high-FODMAP foods. Talk with your dietitian to discuss what dietary choices are best for you.  Summary  A low-FODMAP eating plan is a short-term diet that eliminates FODMAPs from your diet to help ease symptoms of certain bowel diseases.  The eating plan usually lasts up to 6 weeks. After that, high-FODMAP foods are restarted gradually, one at a time, so you can find out which may  be causing symptoms.  A low-FODMAP eating plan can be complicated. It is best to work with a dietitian who has experience with this type of plan. This information is not intended to replace advice given to you by your health care provider. Make sure you discuss any questions you have with your health care provider. Document Released: 12/17/2016 Document Revised: 12/17/2016 Document Reviewed: 12/17/2016 Elsevier Interactive Patient Education  [employer].

## 2018-07-30 NOTE — Progress Notes (Signed)
Chief Complaint  Patient presents with  . Establish Care   New patient  1. Weight gain with meds for bipolar 1. Normal weight 159-164. She was not eating properly or exercising until 2 weeks ago. Abilify can cause 17-22% cause of weight gain  2. Right breast fibroadenoma f/u yearly GI breast center due in 09/2018 3. H/o IBS-D was on dicyclomine and another medication but stopped changed diet and dairy stopped and reduced sx's    Review of Systems  Constitutional: Negative for weight loss.  HENT: Negative for hearing loss.   Eyes: Negative for blurred vision.  Respiratory: Negative for shortness of breath.   Cardiovascular: Negative for chest pain.  Gastrointestinal: Negative for abdominal pain and diarrhea.  Skin: Negative for rash.  Neurological: Negative for headaches.  Psychiatric/Behavioral: Negative for depression. The patient has insomnia.        Sleeping 4-5 hrs     Past Medical History:  Diagnosis Date  . Allergy   . Anxiety   . Bipolar 1 disorder ([place])   . Depression   . Fibroadenoma of right breast   . GERD (gastroesophageal reflux disease)   . IBS (irritable bowel syndrome)    diarrhea   Past Surgical History:  Procedure Laterality Date  . LEG SURGERY Left 2014   gastreochemius surgery leg tendon snapped   . TONSILLECTOMY     Family History  Problem Relation Age of Onset  . Diabetes Maternal Grandmother   . Arthritis Maternal Grandmother   . COPD Maternal Grandmother   . Hyperlipidemia Mother   . Drug abuse Father    Social History   Socioeconomic History  . Marital status: Single    Spouse name: Not on file  . Number of children: Not on file  . Years of education: Not on file  . Highest education level: Not on file  Occupational History  . Not on file  Social Needs  . Financial resource strain: Not on file  . Food insecurity:    Worry: Not on file    Inability: Not on file  . Transportation needs:    Medical: Not on file    Non-medical: Not  on file  Tobacco Use  . Smoking status: Never Smoker  . Smokeless tobacco: Never Used  Substance and Sexual Activity  . Alcohol use: No    Alcohol/week: 0.0 standard drinks  . Drug use: Yes    Types: Marijuana    Comment: occ  . Sexual activity: Yes    Partners: Male  Lifestyle  . Physical activity:    Days per week: Not on file    Minutes per session: Not on file  . Stress: Not on file  Relationships  . Social connections:    Talks on phone: Not on file    Gets together: Not on file    Attends religious service: Not on file    Active member of club or organization: Not on file    Attends meetings of clubs or organizations: Not on file    Relationship status: Not on file  . Intimate partner violence:    Fear of current or ex partner: Not on file    Emotionally abused: Not on file    Physically abused: Not on file    Forced sexual activity: Not on file  Other Topics Concern  . 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 ARIPiprazole (ABILIFY) 10 MG tablet Take 10 mg by mouth daily.  . clonazePAM (KLONOPIN) 0.5 MG disintegrating tablet Take 0.5 mg by mouth as needed.   . traZODone (DESYREL) 50 MG tablet Take 50 mg by mouth at bedtime as needed.    No Known Allergies No results found for this or any previous visit (from the past 2160 hour(s)). Objective  Body mass index is 27.5 kg/m. Wt Readings from Last 3 Encounters:  07/30/18 170 lb 6.4 oz (77.3 kg)  03/10/18 163 lb (73.9 kg)  09/14/14 171 lb (77.6 kg)   Temp Readings from Last 3 Encounters:  07/30/18 98.1 F (36.7 C) (Oral)  09/30/14 98.2 F (36.8 C) (Oral)   BP Readings from Last 3 Encounters:  07/30/18 102/62  03/10/18 120/84  09/30/14 125/78   Pulse Readings from Last 3 Encounters:  07/30/18 (!) 56  03/10/18 61  09/30/14 (!) 56    Physical Exam Vitals signs and nursing note reviewed.   Constitutional:      Appearance: Normal appearance. She is well-developed and well-groomed.  HENT:     Head: Normocephalic and atraumatic.     Nose: Nose normal.     Mouth/Throat:     Mouth: Mucous membranes are moist.     Pharynx: Oropharynx is clear.  Eyes:     Conjunctiva/sclera: Conjunctivae normal.     Pupils: Pupils are equal, round, and reactive to light.  Cardiovascular:     Rate and Rhythm: Normal rate and regular rhythm.     Heart sounds: Normal heart sounds. No murmur.  Pulmonary:     Effort: Pulmonary effort is normal.     Breath sounds: Normal breath sounds.  Skin:    General: Skin is warm and dry.  Neurological:     General: No focal deficit present.     Mental Status: She is alert and oriented to person, place, and time. Mental status is at baseline.     Gait: Gait normal.  Psychiatric:        Attention and Perception: Attention and perception normal.        Mood and Affect: Mood and affect normal.        Speech: Speech normal.        Behavior: Behavior normal. Behavior is cooperative.        Thought Content: Thought content normal.        Cognition and Memory: Cognition and memory normal.        Judgment: Judgment normal.     Assessment   1. Weight gain with meds for bipolar 1. Normal weight 159-164 2. Right breast fibroadenoma  3. H/o IBS-D  4. HM Plan   1. Add metformin 500 mg daily with food if cant tolerate cut in 1/2 dose  rec healthy diet and exercise  F/u with psych 08/07/2018 on [address] in [place] NP [place]   2. [place] right breast GI breast center due 09/2018 get record s 3. Given fodmap diet  Does not want to try dicyclomine any lnoger  Get copy [facility]  4.  Declines flu shot  tdap get record former PCP and labs   Pap Dr. Loni Muse 03/10/18 negative  Declines std check   Records PCP [facility]. Justin Mend  [facility] referred right breast US due 09/2018 [facility]  Fasting labs 01/2019   Eye Dr. Heather Johnson [place] [facility]    Provider: Dr.  Olivia Mackie McLean-Scocuzza-[facility]

## 2018-07-31 ENCOUNTER — Ambulatory Visit: Payer: 59 | Admitting: Internal Medicine

## 2018-08-07 DIAGNOSIS — F411 Generalized anxiety disorder: Secondary | ICD-10-CM | POA: Diagnosis not present

## 2018-08-07 DIAGNOSIS — F314 Bipolar disorder, current episode depressed, severe, without psychotic features: Secondary | ICD-10-CM | POA: Diagnosis not present

## 2018-08-07 MED FILL — GABAPENTIN 300 MG CAPSULE: 300 | 30 days supply | Qty: 60 | Fill #0

## 2018-08-07 MED FILL — ARIPiprazole 5 MG TABS: 5 | 30 days supply | Qty: 30 | Fill #0

## 2018-08-07 MED FILL — traZODone HCL 50 MG TABS: 50 | 30 days supply | Qty: 30 | Fill #0

## 2018-08-24 DIAGNOSIS — F411 Generalized anxiety disorder: Secondary | ICD-10-CM | POA: Diagnosis not present

## 2018-08-24 DIAGNOSIS — F314 Bipolar disorder, current episode depressed, severe, without psychotic features: Secondary | ICD-10-CM | POA: Diagnosis not present

## 2018-08-24 MED FILL — DULoxetine HCL 30 MG CPEP: 30 | 30 days supply | Qty: 30 | Fill #0

## 2018-08-24 MED FILL — clonazePAM 0.5 MG TABS: 0.5 | 30 days supply | Qty: 30 | Fill #0

## 2018-09-17 DIAGNOSIS — F314 Bipolar disorder, current episode depressed, severe, without psychotic features: Secondary | ICD-10-CM | POA: Diagnosis not present

## 2018-09-17 DIAGNOSIS — F411 Generalized anxiety disorder: Secondary | ICD-10-CM | POA: Diagnosis not present

## 2018-09-17 MED FILL — ESCITALOPRAM 10 MG TABLET: 10 | 30 days supply | Qty: 45 | Fill #0

## 2018-09-17 MED FILL — DULoxetine HCL 30 MG CPEP: 30 | 7 days supply | Qty: 7 | Fill #0

## 2018-09-17 MED FILL — ARIPiprazole 10 MG TABS: 10 | 30 days supply | Qty: 30 | Fill #0

## 2018-10-12 DIAGNOSIS — F314 Bipolar disorder, current episode depressed, severe, without psychotic features: Secondary | ICD-10-CM | POA: Diagnosis not present

## 2018-10-12 DIAGNOSIS — F411 Generalized anxiety disorder: Secondary | ICD-10-CM | POA: Diagnosis not present

## 2018-10-12 MED FILL — ARIPiprazole 10 MG TABS: 10 | 30 days supply | Qty: 30 | Fill #0

## 2018-10-12 MED FILL — ESCITALOPRAM 10 MG TABLET: 10 | 30 days supply | Qty: 45 | Fill #0

## 2018-10-21 MED FILL — ESCITALOPRAM 10 MG TABLET: 10 | 30 days supply | Qty: 45 | Fill #0

## 2018-11-09 DIAGNOSIS — F411 Generalized anxiety disorder: Secondary | ICD-10-CM | POA: Diagnosis not present

## 2018-11-09 DIAGNOSIS — F314 Bipolar disorder, current episode depressed, severe, without psychotic features: Secondary | ICD-10-CM | POA: Diagnosis not present

## 2018-11-09 MED FILL — ARIPIPRAZOLE 10 MG TABS: 10 | 30 days supply | Qty: 30 | Fill #0

## 2018-11-10 MED FILL — clonazePAM 0.5 MG TABS: 0.5 | 30 days supply | Qty: 30 | Fill #1

## 2018-11-14 MED FILL — ESCITALOPRAM 10 MG TABLET: 10 | 30 days supply | Qty: 30 | Fill #0

## 2018-11-26 ENCOUNTER — Ambulatory Visit (HOSPITAL_COMMUNITY)
Admission: RE | Admit: 2018-11-26 | Discharge: 2018-11-26 | Disposition: A | Discharge status: Home / Self Care | Payer: 59 | Attending: Psychiatry | Admitting: Psychiatry

## 2018-11-26 DIAGNOSIS — F319 Bipolar disorder, unspecified: Secondary | ICD-10-CM | POA: Insufficient documentation

## 2018-11-26 DIAGNOSIS — F12929 Cannabis use, unspecified with intoxication, unspecified: Secondary | ICD-10-CM | POA: Insufficient documentation

## 2018-11-26 DIAGNOSIS — Z833 Family history of diabetes mellitus: Secondary | ICD-10-CM | POA: Insufficient documentation

## 2018-11-26 DIAGNOSIS — F321 Major depressive disorder, single episode, moderate: Secondary | ICD-10-CM | POA: Diagnosis present
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
## 2018-12-07 DIAGNOSIS — F411 Generalized anxiety disorder: Secondary | ICD-10-CM | POA: Diagnosis not present

## 2018-12-07 DIAGNOSIS — F314 Bipolar disorder, current episode depressed, severe, without psychotic features: Secondary | ICD-10-CM | POA: Diagnosis not present

## 2018-12-07 MED FILL — clonazePAM 0.5 MG TABS: 0.5 | 30 days supply | Qty: 30 | Fill #0

## 2018-12-07 MED FILL — ARIPIPRAZOLE 10 MG TABS: 10 | 30 days supply | Qty: 30 | Fill #0

## 2018-12-31 DIAGNOSIS — F411 Generalized anxiety disorder: Secondary | ICD-10-CM | POA: Diagnosis not present

## 2018-12-31 DIAGNOSIS — F314 Bipolar disorder, current episode depressed, severe, without psychotic features: Secondary | ICD-10-CM | POA: Diagnosis not present

## 2018-12-31 MED FILL — ARIPIPRAZOLE 10 MG TABS: 10 | 30 days supply | Qty: 30 | Fill #0

## 2018-12-31 MED FILL — ESCITALOPRAM 20 MG TABLET: 20 | 30 days supply | Qty: 30 | Fill #0

## 2019-01-04 MED FILL — clonazePAM 0.5 MG TABS: 0.5 | 30 days supply | Qty: 30 | Fill #0

## 2019-01-18 ENCOUNTER — Telehealth: Payer: Self-pay

## 2019-01-18 NOTE — Telephone Encounter (Signed)
Copied from [place] [id number]. Topic: Appointment Scheduling - Scheduling Inquiry for Clinic >> Jan 18, 2019 10:07 AM Rayann Heman wrote: Reason for CRM: pt called and stated that she will be moving out of town on 02/02/19 and would like to reschedule before she leaves and would like to know if she would be worked in. Please advise

## 2019-01-21 ENCOUNTER — Telehealth: Payer: Self-pay | Admitting: *Deleted

## 2019-01-21 NOTE — Telephone Encounter (Signed)
Please place future orders for lab appt.  

## 2019-01-22 ENCOUNTER — Other Ambulatory Visit (INDEPENDENT_AMBULATORY_CARE_PROVIDER_SITE_OTHER): Payer: 59

## 2019-01-22 ENCOUNTER — Other Ambulatory Visit: Payer: Self-pay

## 2019-01-22 ENCOUNTER — Encounter: Payer: Self-pay | Admitting: Internal Medicine

## 2019-01-22 ENCOUNTER — Other Ambulatory Visit: Payer: Self-pay | Admitting: Internal Medicine

## 2019-01-22 DIAGNOSIS — Z0000 Encounter for general adult medical examination without abnormal findings: Secondary | ICD-10-CM

## 2019-01-22 DIAGNOSIS — Z1329 Encounter for screening for other suspected endocrine disorder: Secondary | ICD-10-CM

## 2019-01-22 DIAGNOSIS — E559 Vitamin D deficiency, unspecified: Secondary | ICD-10-CM

## 2019-01-22 DIAGNOSIS — Z1389 Encounter for screening for other disorder: Secondary | ICD-10-CM | POA: Diagnosis not present

## 2019-01-22 DIAGNOSIS — Z13220 Encounter for screening for lipoid disorders: Secondary | ICD-10-CM | POA: Diagnosis not present

## 2019-01-22 DIAGNOSIS — E785 Hyperlipidemia, unspecified: Secondary | ICD-10-CM | POA: Insufficient documentation

## 2019-01-22 LAB — CBC WITH DIFFERENTIAL/PLATELET
Basophils Absolute: 0 10*3/uL (ref 0.0–0.1)
Basophils Relative: 0.2 % (ref 0.0–3.0)
Eosinophils Absolute: 0.1 10*3/uL (ref 0.0–0.7)
Eosinophils Relative: 1.4 % (ref 0.0–5.0)
HCT: 35.7 % — ABNORMAL LOW (ref 36.0–46.0)
Hemoglobin: 12 g/dL (ref 12.0–15.0)
Lymphocytes Relative: 43.7 % (ref 12.0–46.0)
Lymphs Abs: 1.9 10*3/uL (ref 0.7–4.0)
MCHC: 33.7 g/dL (ref 30.0–36.0)
MCV: 92.9 fl (ref 78.0–100.0)
Monocytes Absolute: 0.3 10*3/uL (ref 0.1–1.0)
Monocytes Relative: 6.5 % (ref 3.0–12.0)
Neutro Abs: 2.1 10*3/uL (ref 1.4–7.7)
Neutrophils Relative %: 48.2 % (ref 43.0–77.0)
Platelets: 289 10*3/uL (ref 150.0–400.0)
RBC: 3.84 Mil/uL — ABNORMAL LOW (ref 3.87–5.11)
RDW: 12.4 % (ref 11.5–15.5)
WBC: 4.4 10*3/uL (ref 4.0–10.5)

## 2019-01-22 LAB — COMPREHENSIVE METABOLIC PANEL
ALT: 13 U/L (ref 0–35)
AST: 14 U/L (ref 0–37)
Albumin: 4.3 g/dL (ref 3.5–5.2)
Alkaline Phosphatase: 57 U/L (ref 39–117)
BUN: 8 mg/dL (ref 6–23)
CO2: 31 mEq/L (ref 19–32)
Calcium: 9.9 mg/dL (ref 8.4–10.5)
Chloride: 101 mEq/L (ref 96–112)
Creatinine, Ser: 0.69 mg/dL (ref 0.40–1.20)
GFR: 126.02 mL/min (ref >60.00)
Glucose, Bld: 94 mg/dL (ref 70–99)
Potassium: 3.9 mEq/L (ref 3.5–5.1)
Sodium: 138 mEq/L (ref 135–145)
Total Bilirubin: 0.5 mg/dL (ref 0.2–1.2)
Total Protein: 7.2 g/dL (ref 6.0–8.3)

## 2019-01-22 LAB — LIPID PANEL
Cholesterol: 224 mg/dL — ABNORMAL HIGH (ref 0–200)
HDL: 74.9 mg/dL (ref >39.00)
LDL Cholesterol: 131 mg/dL — ABNORMAL HIGH (ref 0–99)
NonHDL: 149.54
Total CHOL/HDL Ratio: 3
Triglycerides: 91 mg/dL (ref 0.0–149.0)
VLDL: 18.2 mg/dL (ref 0.0–40.0)

## 2019-01-22 LAB — TSH: TSH: 1.8 u[IU]/mL (ref 0.35–4.50)

## 2019-01-22 LAB — VITAMIN D 25 HYDROXY (VIT D DEFICIENCY, FRACTURES): VITD: 15.88 ng/mL — ABNORMAL LOW (ref 30.00–100.00)

## 2019-01-22 MED ORDER — CHOLECALCIFEROL 1.25 MG (50000 UT) PO CAPS: 50000.0000 [IU] | ORAL_CAPSULE | ORAL | 1 refills | Status: AC

## 2019-01-22 MED FILL — VITAMIN D3 50000 UNIT CAPS: 1.25 MG | 84 days supply | Qty: 12 | Fill #0

## 2019-01-23 LAB — URINALYSIS, ROUTINE W REFLEX MICROSCOPIC
Bilirubin Urine: NEGATIVE
Glucose, UA: NEGATIVE
Hgb urine dipstick: NEGATIVE
Ketones, ur: NEGATIVE
Leukocytes,Ua: NEGATIVE
Nitrite: NEGATIVE
Protein, ur: NEGATIVE
Specific Gravity, Urine: 1.005 (ref 1.001–1.03)
pH: 8 (ref 5.0–8.0)

## 2019-01-26 ENCOUNTER — Encounter: Payer: Self-pay | Admitting: Internal Medicine

## 2019-01-26 ENCOUNTER — Ambulatory Visit (INDEPENDENT_AMBULATORY_CARE_PROVIDER_SITE_OTHER): Payer: 59 | Admitting: Internal Medicine

## 2019-01-26 ENCOUNTER — Other Ambulatory Visit: Payer: Self-pay

## 2019-01-26 DIAGNOSIS — F411 Generalized anxiety disorder: Secondary | ICD-10-CM | POA: Diagnosis not present

## 2019-01-26 DIAGNOSIS — E559 Vitamin D deficiency, unspecified: Secondary | ICD-10-CM

## 2019-01-26 DIAGNOSIS — F314 Bipolar disorder, current episode depressed, severe, without psychotic features: Secondary | ICD-10-CM | POA: Diagnosis not present

## 2019-01-26 DIAGNOSIS — E785 Hyperlipidemia, unspecified: Secondary | ICD-10-CM | POA: Diagnosis not present

## 2019-01-26 DIAGNOSIS — Z0000 Encounter for general adult medical examination without abnormal findings: Secondary | ICD-10-CM | POA: Diagnosis not present

## 2019-01-26 DIAGNOSIS — Z23 Encounter for immunization: Secondary | ICD-10-CM | POA: Diagnosis not present

## 2019-01-26 MED ORDER — TETANUS-DIPHTH-ACELL PERTUSSIS 5-2.5-18.5 LF-MCG/0.5 IM SUSP: 0.5000 mL | Freq: Once | INTRAMUSCULAR | 0 refills | Status: AC

## 2019-01-26 MED FILL — ARIPIPRAZOLE 10 MG TABS: 10 | 90 days supply | Qty: 90 | Fill #0

## 2019-01-26 MED FILL — ESCITALOPRAM 20 MG TABLET: 20 | 90 days supply | Qty: 90 | Fill #0

## 2019-01-26 NOTE — Patient Instructions (Signed)
High Cholesterol  High cholesterol is a condition in which the blood has high levels of a white, waxy, fat-like substance (cholesterol). The human body needs small amounts of cholesterol. The liver makes all the cholesterol that the body needs. Extra (excess) cholesterol comes from the food that we eat. Cholesterol is carried from the liver by the blood through the blood vessels. If you have high cholesterol, deposits (plaques) may build up on the walls of your blood vessels (arteries). Plaques make the arteries narrower and stiffer. Cholesterol plaques increase your risk for heart attack and stroke. Work with your health care provider to keep your cholesterol levels in a healthy range. What increases the risk? This condition is more likely to develop in people who:  Eat foods that are high in animal fat (saturated fat) or cholesterol.  Are overweight.  Are not getting enough exercise.  Have a family history of high cholesterol. What are the signs or symptoms? There are no symptoms of this condition. How is this diagnosed? This condition may be diagnosed from the results of a blood test.  If you are older than age 20, your health care provider may check your cholesterol every 4-6 years.  You may be checked more often if you already have high cholesterol or other risk factors for heart disease. The blood test for cholesterol measures:  "Bad" cholesterol (LDL cholesterol). This is the main type of cholesterol that causes heart disease. The desired level for LDL is less than 100.  "Good" cholesterol (HDL cholesterol). This type helps to protect against heart disease by cleaning the arteries and carrying the LDL away. The desired level for HDL is 60 or higher.  Triglycerides. These are fats that the body can store or burn for energy. The desired number for triglycerides is lower than 150.  Total cholesterol. This is a measure of the total amount of cholesterol in your blood, including LDL  cholesterol, HDL cholesterol, and triglycerides. A healthy number is less than 200. How is this treated? This condition is treated with diet changes, lifestyle changes, and medicines. Diet changes  This may include eating more whole grains, fruits, vegetables, nuts, and fish.  This may also include cutting back on red meat and foods that have a lot of added sugar. Lifestyle changes  Changes may include getting at least 40 minutes of aerobic exercise 3 times a week. Aerobic exercises include walking, biking, and swimming. Aerobic exercise along with a healthy diet can help you maintain a healthy weight.  Changes may also include quitting smoking. Medicines  Medicines are usually given if diet and lifestyle changes have failed to reduce your cholesterol to healthy levels.  Your health care provider may prescribe a statin medicine. Statin medicines have been shown to reduce cholesterol, which can reduce the risk of heart disease. Follow these instructions at home: Eating and drinking If told by your health care provider:  Eat chicken (without skin), fish, veal, shellfish, ground turkey breast, and round or loin cuts of red meat.  Do not eat fried foods or fatty meats, such as hot dogs and salami.  Eat plenty of fruits, such as apples.  Eat plenty of vegetables, such as broccoli, potatoes, and carrots.  Eat beans, peas, and lentils.  Eat grains such as barley, rice, couscous, and bulgur wheat.  Eat pasta without cream sauces.  Use skim or nonfat milk, and eat low-fat or nonfat yogurt and cheeses.  Do not eat or drink whole milk, cream, ice cream, egg yolks,   or hard cheeses.  Do not eat stick margarine or tub margarines that contain trans fats (also called partially hydrogenated oils).  Do not eat saturated tropical oils, such as coconut oil and palm oil.  Do not eat cakes, cookies, crackers, or other baked goods that contain trans fats.  General instructions  Exercise as  directed by your health care provider. Increase your activity level with activities such as gardening, walking, and taking the stairs.  Take over-the-counter and prescription medicines only as told by your health care provider.  Do not use any products that contain nicotine or tobacco, such as cigarettes and e-cigarettes. If you need help quitting, ask your health care provider.  Keep all follow-up visits as told by your health care provider. This is important. Contact a health care provider if:  You are struggling to maintain a healthy diet or weight.  You need help to start on an exercise program.  You need help to stop smoking. Get help right away if:  You have chest pain.  You have trouble breathing. This information is not intended to replace advice given to you by your health care provider. Make sure you discuss any questions you have with your health care provider. Document Released: 04/22/2005 Document Revised: 04/25/2017 Document Reviewed: 10/21/2015 Elsevier Patient Education  [place].  Cholesterol Content in Foods Cholesterol is a waxy, fat-like substance that helps to carry fat in the blood. The body needs cholesterol in small amounts, but too much cholesterol can cause damage to the arteries and heart. Most people should eat less than 200 milligrams (mg) of cholesterol a day. Foods with cholesterol  Cholesterol is found in animal-based foods, such as meat, seafood, and dairy. Generally, low-fat dairy and lean meats have less cholesterol than full-fat dairy and fatty meats. The milligrams of cholesterol per serving (mg per serving) of common cholesterol-containing foods are listed below. Meat and other proteins  Egg - one large whole egg has 186 mg.  Veal shank - 4 oz has 141 mg.  Lean ground [place] (93% lean) - 4 oz has 118 mg.  Fat-trimmed lamb loin - 4 oz has 106 mg.  Lean ground beef (90% lean) - 4 oz has 100 mg.  Lobster - 3.5 oz has 90 mg.  Pork  loin chops - 4 oz has 86 mg.  Canned salmon - 3.5 oz has 83 mg.  Fat-trimmed beef top loin - 4 oz has 78 mg.  Frankfurter - 1 frank (3.5 oz) has 77 mg.  Crab - 3.5 oz has 71 mg.  Roasted chicken without skin, white meat - 4 oz has 66 mg.  Light bologna - 2 oz has 45 mg.  Deli-cut [place] - 2 oz has 31 mg.  Canned tuna - 3.5 oz has 31 mg.  Bacon - 1 oz has 29 mg.  Oysters and mussels (raw) - 3.5 oz has 25 mg.  Mackerel - 1 oz has 22 mg.  Trout - 1 oz has 20 mg.  Pork sausage - 1 link (1 oz) has 17 mg.  Salmon - 1 oz has 16 mg.  Tilapia - 1 oz has 14 mg. Dairy  Soft-serve ice cream -  cup (4 oz) has 103 mg.  Whole-milk yogurt - 1 cup (8 oz) has 29 mg.  Cheddar cheese - 1 oz has 28 mg.  American cheese - 1 oz has 28 mg.  Whole milk - 1 cup (8 oz) has 23 mg.  2% milk - 1 cup (8  oz) has 18 mg.  Cream cheese - 1 tablespoon (Tbsp) has 15 mg.  Cottage cheese -  cup (4 oz) has 14 mg.  Low-fat (1%) milk - 1 cup (8 oz) has 10 mg.  Sour cream - 1 Tbsp has 8.5 mg.  Low-fat yogurt - 1 cup (8 oz) has 8 mg.  Nonfat Greek yogurt - 1 cup (8 oz) has 7 mg.  Half-and-half cream - 1 Tbsp has 5 mg. Fats and oils  Cod liver oil - 1 tablespoon (Tbsp) has 82 mg.  Butter - 1 Tbsp has 15 mg.  Lard - 1 Tbsp has 14 mg.  Bacon grease - 1 Tbsp has 14 mg.  Mayonnaise - 1 Tbsp has 5-10 mg.  Margarine - 1 Tbsp has 3-10 mg. Exact amounts of cholesterol in these foods may vary depending on specific ingredients and brands. Foods without cholesterol Most plant-based foods do not have cholesterol unless you combine them with a food that has cholesterol. Foods without cholesterol include:  Grains and cereals.  Vegetables.  Fruits.  Vegetable oils, such as olive, canola, and sunflower oil.  Legumes, such as peas, beans, and lentils.  Nuts and seeds.  Egg whites. Summary  The body needs cholesterol in small amounts, but too much cholesterol can cause damage to the  arteries and heart.  Most people should eat less than 200 milligrams (mg) of cholesterol a day. This information is not intended to replace advice given to you by your health care provider. Make sure you discuss any questions you have with your health care provider. Document Released: 12/17/2016 Document Revised: 04/04/2017 Document Reviewed: 12/17/2016 Elsevier Patient Education  2020 [employer].

## 2019-01-26 NOTE — Progress Notes (Signed)
Virtual Visit via Video Note  I connected with Kimberly Mcclain  on 01/26/19 at 11:30 AM EDT by a video enabled telemedicine application and verified that I am speaking with the correct person using two identifiers.  Location patient: home Location provider:work  Persons participating in the virtual visit: patient, provider  I discussed the limitations of evaluation and management by telemedicine and the availability of in person appointments. The patient expressed understanding and agreed to proceed.   HPI: 1. Annual reviewed labs vit D def and HLD she reports she has been eating more juk food and gained 30 lbs since the pandemic  2. 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 Agreeable to flu and Tdap    ROS: See pertinent positives and negatives per HPI. General: gained 20 lbs  HEENT: no sore throat CV:no chest pain  Lungs: no sob  GI: no ab pain  GU: no issues  MSK: no jt pain  Neuro: no h/a  Psych: no depression  Skin :no issues   Past Medical History:  Diagnosis Date  . Allergy   . Anxiety   . Bipolar 1 disorder ([place])   . Depression   . Fibroadenoma of right breast   . GERD (gastroesophageal reflux disease)   . IBS (irritable bowel syndrome)    diarrhea    Past Surgical History:  Procedure Laterality Date  . LEG SURGERY Left 2014   gastreochemius surgery leg tendon snapped   . TONSILLECTOMY      Family History  Problem Relation Age of Onset  . Diabetes Maternal Grandmother   . Arthritis Maternal Grandmother   . COPD Maternal Grandmother   . Hyperlipidemia Mother   . 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  ARIPiprazole (ABILIFY) 10 MG tablet, Take 10 mg by mouth daily., Disp: , Rfl:  .  Cholecalciferol 1.25 MG (50000 UT) capsule, Take 1 capsule  (50,000 Units total) by mouth once a week., Disp: 13 capsule, Rfl: 1 .  clonazePAM (KLONOPIN) 0.5 MG disintegrating tablet, Take 0.5 mg by mouth as needed. , Disp: , Rfl:  .  fexofenadine (ALLEGRA) 180 MG tablet, Take 180 mg by mouth daily., Disp: , Rfl:  .  metFORMIN (GLUCOPHAGE) 500 MG tablet, Take 1 tablet (500 mg total) by mouth daily with breakfast., Disp: 90 tablet, Rfl: 3 .  traZODone (DESYREL) 50 MG tablet, Take 50 mg by mouth at bedtime as needed. , Disp: , Rfl:   EXAM:  VITALS per patient if applicable:  GENERAL: alert, oriented, appears well and in no acute distress  HEENT: atraumatic, conjunttiva clear, no obvious abnormalities on inspection of external nose and ears  NECK: normal movements of the head and neck  LUNGS: on inspection no signs of respiratory distress, breathing rate appears normal, no obvious gross SOB, gasping or wheezing  CV: no obvious cyanosis  MS: moves all visible extremities without noticeable abnormality  PSYCH/NEURO: pleasant and cooperative, no obvious depression or anxiety, speech and thought processing grossly intact  ASSESSMENT AND PLAN:  Discussed the following assessment and plan:  Annual physical exam Declines flu shot but reconsidering may get at pharmacy  tdap due pt to get at pharmacy sent Rx   Pap Dr. Loni Muse 03/10/18 negative  Declines std  check   Records [facility] referred right breast US due 01/29/19 [facility]  rec healthy diet and exercise   Hyperlipidemia, unspecified hyperlipidemia type -rec healthy diet and exercise  Sent info via my chart   Vitamin D deficiency  D350K IU x 6 months then 4000 IU qd otc     Eye Dr. Donney Rankins [place] [facility]    -we discussed possible serious and likely etiologies, options for evaluation and workup, limitations of telemedicine visit vs in person visit, treatment, treatment risks and precautions. Pt prefers to treat via  telemedicine empirically rather then risking or undertaking an in person visit at this moment. Patient agrees to seek prompt in person care if worsening, new symptoms arise, or if is not improving with treatment.   I discussed the assessment and treatment plan with the patient. The patient was provided an opportunity to ask questions and all were answered. The patient agreed with the plan and demonstrated an understanding of the instructions.   The patient was advised to call back or seek an in-person evaluation if the symptoms worsen or if the condition fails to improve as anticipated.  Time spent 15 minutes  Delorise Jackson, MD

## 2019-01-27 ENCOUNTER — Telehealth: Payer: Self-pay | Admitting: Internal Medicine

## 2019-01-27 ENCOUNTER — Other Ambulatory Visit: Payer: 59

## 2019-01-27 NOTE — Telephone Encounter (Signed)
Left message for patient to return call back. [facility] may give results and obtain information.  

## 2019-01-27 NOTE — Telephone Encounter (Signed)
[facility] does not give Tdap  She will have to come here or regular pharmacy   What does she want to do? And to which pharmacy will she go if not here for Tdap vaccine?

## 2019-01-29 ENCOUNTER — Ambulatory Visit
Admission: RE | Admit: 2019-01-29 | Discharge: 2019-01-29 | Disposition: A | Discharge status: Home / Self Care | Payer: 59 | Source: Ambulatory Visit | Attending: Internal Medicine | Admitting: Internal Medicine

## 2019-01-29 ENCOUNTER — Other Ambulatory Visit: Payer: Self-pay

## 2019-01-29 DIAGNOSIS — D241 Benign neoplasm of right breast: Secondary | ICD-10-CM

## 2019-01-29 DIAGNOSIS — N6312 Unspecified lump in the right breast, upper inner quadrant: Secondary | ICD-10-CM | POA: Diagnosis not present

## 2019-01-31 IMAGING — US ULTRASOUND RIGHT BREAST LIMITED
1 series · 5 of 5 positions shown · non-contrast
Comparison: 04/30/2017, 10/02/2016

CLINICAL DATA: Patient has for 1 year follow-up of probably benign
fibroadenoma in the RIGHT breast. Patient reports no interval
changes in mass.

EXAM:
ULTRASOUND OF THE RIGHT BREAST

[Series 1: ultrasound right breast limited · 0.06mm/px · 5 of 5 slices shown]
[im 1/5]
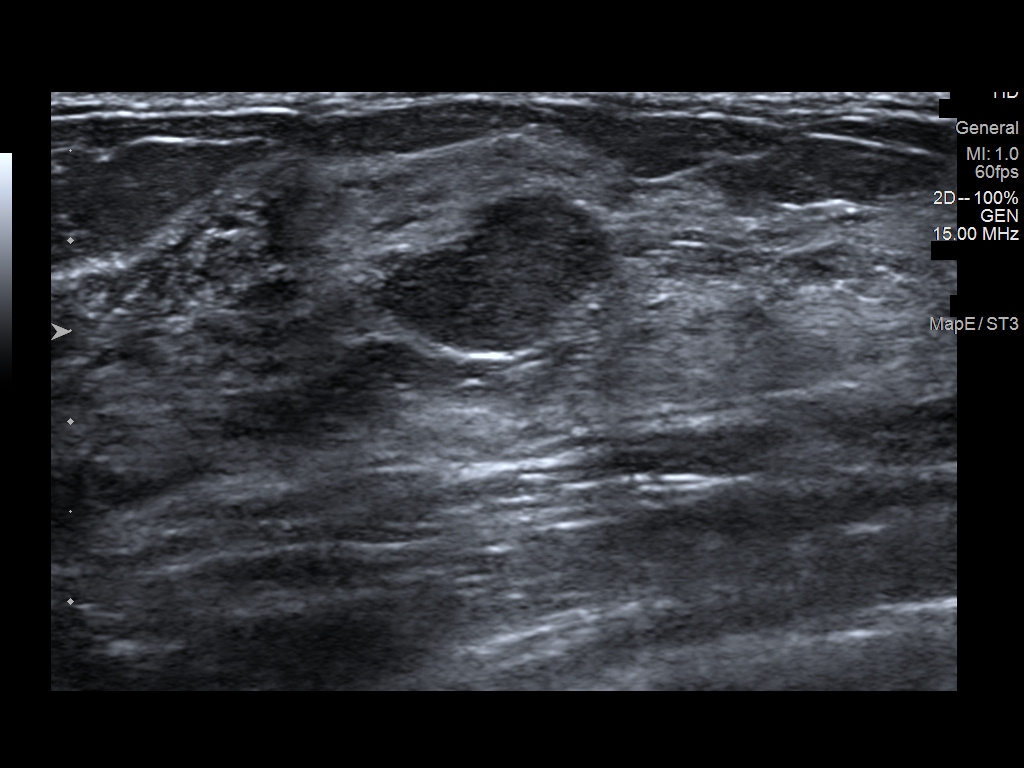
[im 2/5]
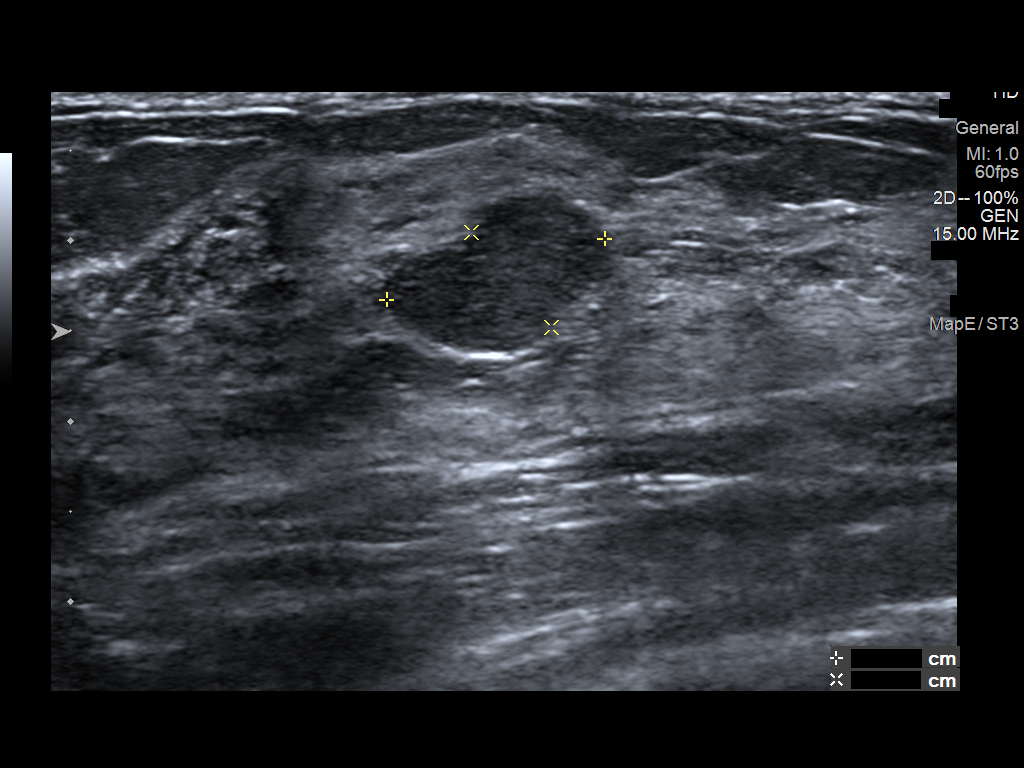
[im 3/5]
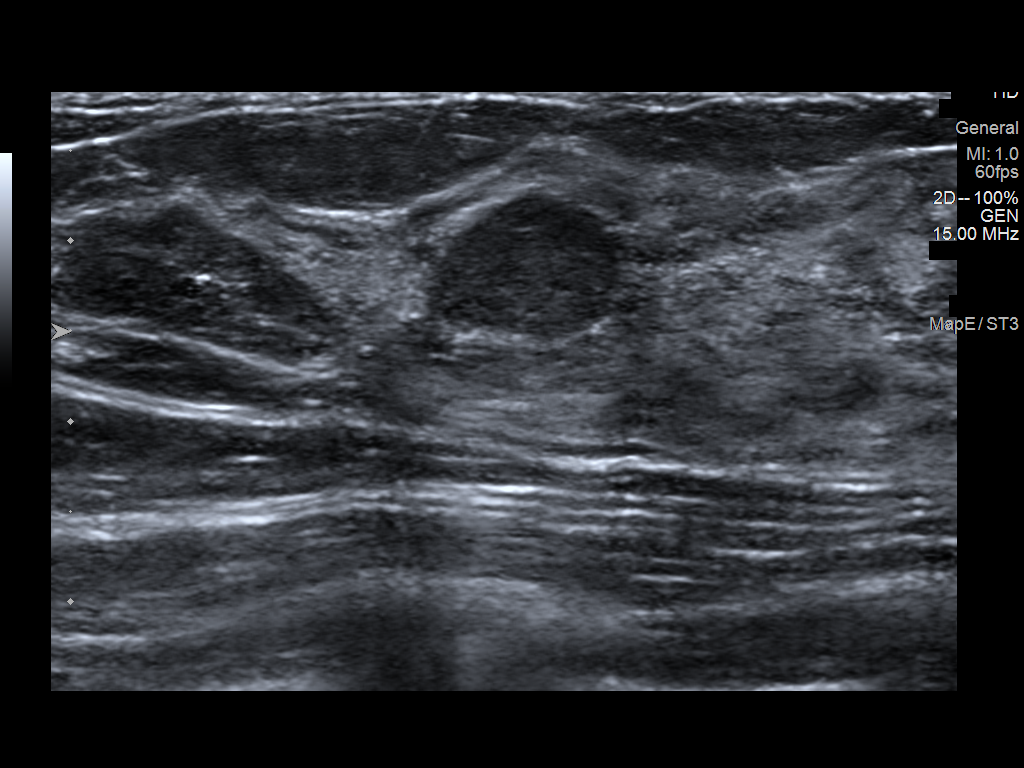
[im 4/5]
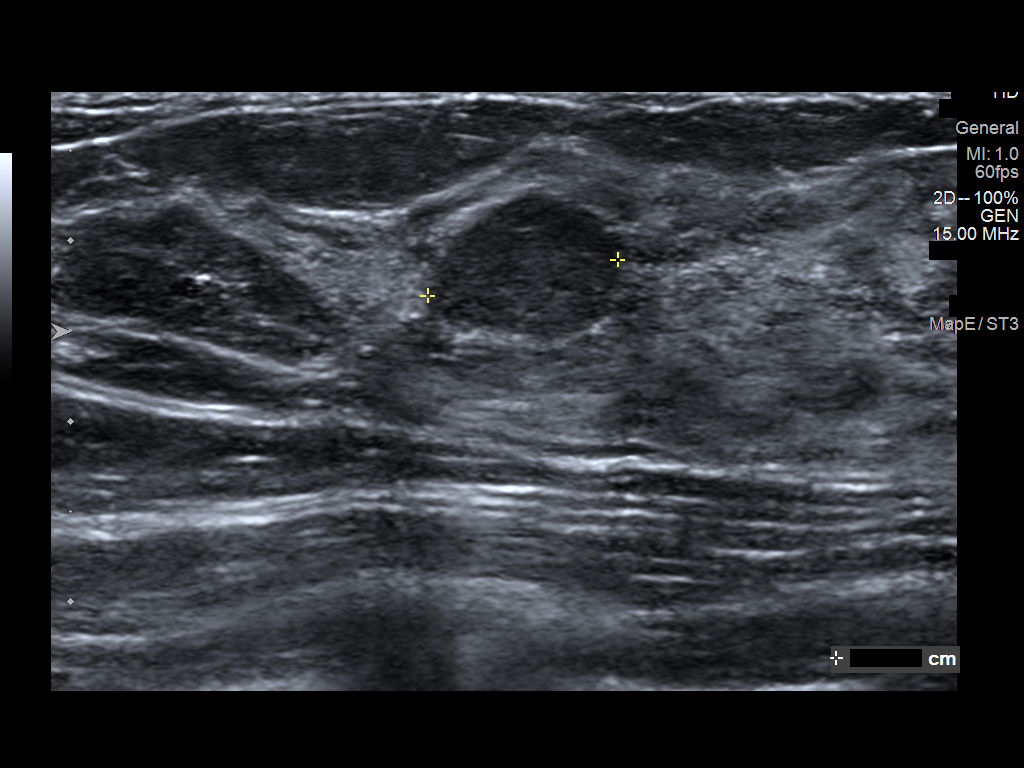
[im 5/5]
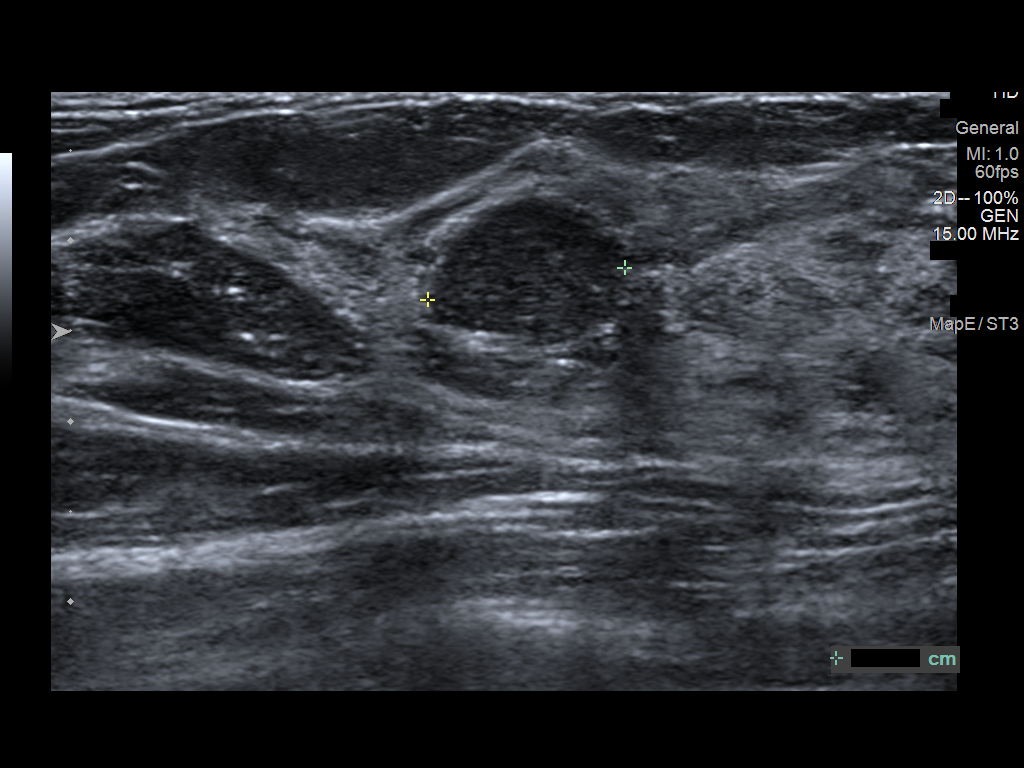

[5 of 5 positions shown; findings below may reference images not displayed]

FINDINGS: Targeted ultrasound is performed, showing a circumscribed oval
parallel hypoechoic mass with posterior acoustic enhancement in the
12:30 o'clock location of the RIGHT breast 4 centimeters from the
nipple which measures 1.3 x 0.7 x 1.1 centimeters. Lesion
demonstrates no atypical features.
IMPRESSION: Probably benign fibroadenoma in the RIGHT breast shows 1 year of
stability. Continued followup is recommended to document 2 years of
stability.

RECOMMENDATION:
RIGHT breast ultrasound is recommended in 1 year.

I have discussed the findings and recommendations with the patient.
Results were also provided in writing at the conclusion of the
visit. If applicable, a reminder letter will be sent to the patient
regarding the next appointment.

BI-RADS CATEGORY  3: Probably benign.

## 2019-02-02 ENCOUNTER — Ambulatory Visit: Payer: 59 | Admitting: Internal Medicine

## 2019-02-16 ENCOUNTER — Encounter: Payer: Self-pay | Admitting: Radiology

## 2019-03-11 DIAGNOSIS — H9211 Otorrhea, right ear: Secondary | ICD-10-CM | POA: Diagnosis not present

## 2019-03-11 DIAGNOSIS — H6001 Abscess of right external ear: Secondary | ICD-10-CM | POA: Diagnosis not present

## 2019-03-16 DIAGNOSIS — H6001 Abscess of right external ear: Secondary | ICD-10-CM | POA: Diagnosis not present

## 2019-03-23 DIAGNOSIS — H6001 Abscess of right external ear: Secondary | ICD-10-CM | POA: Diagnosis not present

## 2019-03-29 ENCOUNTER — Other Ambulatory Visit: Payer: Self-pay

## 2019-03-29 DIAGNOSIS — Z20822 Contact with and (suspected) exposure to covid-19: Secondary | ICD-10-CM

## 2019-03-31 LAB — NOVEL CORONAVIRUS, NAA: SARS-CoV-2, NAA: NOT DETECTED

## 2019-04-05 DIAGNOSIS — L723 Sebaceous cyst: Secondary | ICD-10-CM | POA: Diagnosis not present

## 2019-04-22 DIAGNOSIS — F411 Generalized anxiety disorder: Secondary | ICD-10-CM | POA: Diagnosis not present

## 2019-04-22 DIAGNOSIS — F314 Bipolar disorder, current episode depressed, severe, without psychotic features: Secondary | ICD-10-CM | POA: Diagnosis not present

## 2019-04-23 MED FILL — clonazePAM 0.5 MG TABS: 0.5 | 30 days supply | Qty: 30 | Fill #0

## 2019-04-23 MED FILL — ARIPIPRAZOLE 10 MG TABS: 10 | 90 days supply | Qty: 90 | Fill #0

## 2019-04-23 MED FILL — ESCITALOPRAM 20 MG TABLET: 20 | 90 days supply | Qty: 90 | Fill #0

## 2019-05-03 ENCOUNTER — Ambulatory Visit: Payer: 59 | Attending: Internal Medicine

## 2019-05-03 DIAGNOSIS — U071 COVID-19: Secondary | ICD-10-CM | POA: Diagnosis not present

## 2019-05-03 DIAGNOSIS — R238 Other skin changes: Secondary | ICD-10-CM | POA: Diagnosis not present

## 2019-05-05 LAB — NOVEL CORONAVIRUS, NAA: SARS-CoV-2, NAA: NOT DETECTED

## 2019-05-11 DIAGNOSIS — H5213 Myopia, bilateral: Secondary | ICD-10-CM | POA: Diagnosis not present

## 2019-05-17 ENCOUNTER — Ambulatory Visit: Payer: 59 | Admitting: Family Medicine

## 2019-08-28 DIAGNOSIS — Z23 Encounter for immunization: Secondary | ICD-10-CM | POA: Diagnosis not present

## 2019-09-18 DIAGNOSIS — Z23 Encounter for immunization: Secondary | ICD-10-CM | POA: Diagnosis not present

## 2019-11-10 ENCOUNTER — Encounter: Payer: Self-pay | Admitting: Advanced Practice Midwife

## 2019-11-10 ENCOUNTER — Other Ambulatory Visit: Payer: Self-pay

## 2019-11-10 ENCOUNTER — Ambulatory Visit (INDEPENDENT_AMBULATORY_CARE_PROVIDER_SITE_OTHER): Payer: 59 | Admitting: Advanced Practice Midwife

## 2019-11-10 ENCOUNTER — Other Ambulatory Visit (HOSPITAL_COMMUNITY)
Admission: RE | Admit: 2019-11-10 | Discharge: 2019-11-10 | Disposition: A | Discharge status: Home / Self Care | Payer: 59 | Source: Ambulatory Visit | Attending: Obstetrics & Gynecology | Admitting: Obstetrics & Gynecology

## 2019-11-10 VITALS — BP 111/75 | HR 72 | Wt 157.8 lb

## 2019-11-10 DIAGNOSIS — F319 Bipolar disorder, unspecified: Secondary | ICD-10-CM | POA: Diagnosis not present

## 2019-11-10 DIAGNOSIS — N941 Unspecified dyspareunia: Secondary | ICD-10-CM | POA: Diagnosis not present

## 2019-11-10 DIAGNOSIS — R634 Abnormal weight loss: Secondary | ICD-10-CM

## 2019-11-10 DIAGNOSIS — D241 Benign neoplasm of right breast: Secondary | ICD-10-CM | POA: Diagnosis not present

## 2019-11-10 DIAGNOSIS — Z01419 Encounter for gynecological examination (general) (routine) without abnormal findings: Secondary | ICD-10-CM | POA: Insufficient documentation

## 2019-11-10 DIAGNOSIS — Z3009 Encounter for other general counseling and advice on contraception: Secondary | ICD-10-CM

## 2019-11-10 NOTE — Progress Notes (Signed)
Last pap 03/10/2018- normal STI testing today Discuss birth control

## 2019-11-10 NOTE — Patient Instructions (Signed)
Preventive Care 21-25 Years Old, Female Preventive care refers to visits with your health care provider and lifestyle choices that can promote health and wellness. This includes:  A yearly physical exam. This may also be called an annual well check.  Regular dental visits and eye exams.  Immunizations.  Screening for certain conditions.  Healthy lifestyle choices, such as eating a healthy diet, getting regular exercise, not using drugs or products that contain nicotine and tobacco, and limiting alcohol use. What can I expect for my preventive care visit? Physical exam Your health care provider will check your:  Height and weight. This may be used to calculate body mass index (BMI), which tells if you are at a healthy weight.  Heart rate and blood pressure.  Skin for abnormal spots. Counseling Your health care provider may ask you questions about your:  Alcohol, tobacco, and drug use.  Emotional well-being.  Home and relationship well-being.  Sexual activity.  Eating habits.  Work and work environment.  Method of birth control.  Menstrual cycle.  Pregnancy history. What immunizations do I need?  Influenza (flu) vaccine  This is recommended every year. Tetanus, diphtheria, and pertussis (Tdap) vaccine  You may need a Td booster every 10 years. Varicella (chickenpox) vaccine  You may need this if you have not been vaccinated. Human papillomavirus (HPV) vaccine  If recommended by your health care provider, you may need three doses over 6 months. Measles, mumps, and rubella (MMR) vaccine  You may need at least one dose of MMR. You may also need a second dose. Meningococcal conjugate (MenACWY) vaccine  One dose is recommended if you are age 19-21 years and a first-year college student living in a residence hall, or if you have one of several medical conditions. You may also need additional booster doses. Pneumococcal conjugate (PCV13) vaccine  You may need  this if you have certain conditions and were not previously vaccinated. Pneumococcal polysaccharide (PPSV23) vaccine  You may need one or two doses if you smoke cigarettes or if you have certain conditions. Hepatitis A vaccine  You may need this if you have certain conditions or if you travel or work in places where you may be exposed to hepatitis A. Hepatitis B vaccine  You may need this if you have certain conditions or if you travel or work in places where you may be exposed to hepatitis B. Haemophilus influenzae type b (Hib) vaccine  You may need this if you have certain conditions. You may receive vaccines as individual doses or as more than one vaccine together in one shot (combination vaccines). Talk with your health care provider about the risks and benefits of combination vaccines. What tests do I need?  Blood tests  Lipid and cholesterol levels. These may be checked every 5 years starting at age 20.  Hepatitis C test.  Hepatitis B test. Screening  Diabetes screening. This is done by checking your blood sugar (glucose) after you have not eaten for a while (fasting).  Sexually transmitted disease (STD) testing.  BRCA-related cancer screening. This may be done if you have a family history of breast, ovarian, tubal, or peritoneal cancers.  Pelvic exam and Pap test. This may be done every 3 years starting at age 21. Starting at age 30, this may be done every 5 years if you have a Pap test in combination with an HPV test. Talk with your health care provider about your test results, treatment options, and if necessary, the need for more tests.   Follow these instructions at home: Eating and drinking   Eat a diet that includes fresh fruits and vegetables, whole grains, lean protein, and low-fat dairy.  Take vitamin and mineral supplements as recommended by your health care provider.  Do not drink alcohol if: ? Your health care provider tells you not to drink. ? You are  pregnant, may be pregnant, or are planning to become pregnant.  If you drink alcohol: ? Limit how much you have to 0-1 drink a day. ? Be aware of how much alcohol is in your drink. In the [place] one drink equals one 12 oz bottle of beer (355 mL), one 5 oz glass of wine (148 mL), or one 1 oz glass of hard liquor (44 mL). Lifestyle  Take daily care of your teeth and gums.  Stay active. Exercise for at least 30 minutes on 5 or more days each week.  Do not use any products that contain nicotine or tobacco, such as cigarettes, e-cigarettes, and chewing tobacco. If you need help quitting, ask your health care provider.  If you are sexually active, practice safe sex. Use a condom or other form of birth control (contraception) in order to prevent pregnancy and STIs (sexually transmitted infections). If you plan to become pregnant, see your health care provider for a preconception visit. What's next?  Visit your health care provider once a year for a well check visit.  Ask your health care provider how often you should have your eyes and teeth checked.  Stay up to date on all vaccines. This information is not intended to replace advice given to you by your health care provider. Make sure you discuss any questions you have with your health care provider. Document Revised: 01/01/2018 Document Reviewed: 01/01/2018 Elsevier Patient Education  2020 [employer].

## 2019-11-10 NOTE — Progress Notes (Signed)
GYNECOLOGY ANNUAL PREVENTATIVE CARE ENCOUNTER NOTE  History:     Kimberly Mcclain is a 24 y.o. G0P0000 female here for a routine annual gynecologic exam.  Current complaints: pressure and discomfort during intercourse, new onset about three weeks ago. Discomfort is position-dependent. Denies abnormal vaginal bleeding, discharge, pelvic pain, or other gynecologic concerns.    Patient endorses "eating challenges" which she attributes to life stress and subsequent loss of appetite. She reports gaining about 30lbs during COVID quarantine and states she "spent 2020 mostly in bed". She moved to [place] in January 2021 and has lost about 30lbs since that time. She endorses loss of appetite. She is in weekly therapy and is managing stress with some journaling and poetry.   Patient disagrees with diagnosis of Bipolar Disorder currently on her problem list. She states her psychiatrist and therapist were not consistent with that diagnosis and patient feels she is more appropriate for diagnosis of ADHD due to absence of manic episodes. She discontinued her use of Abilify and Lexapro when she moved to [place] in January 2021 and endorses feeling stable with the aforementioned therapist/ CBT.   Non-smoker, female partners, denies SI, HI, IPV. Stays with her mom when in [place], endorses reliable support network.    Gynecologic History Patient's last menstrual period was 11/01/2019. Contraception: condoms Last Pap: 03/10/2018. Results were: normal with negative HPV Last mammogram: N/A. S/p ultrasound for stable right fibroadenoma 01/29/2019  Obstetric History OB History  Gravida Para Term Preterm AB Living  0 0 0 0 0 0  SAB TAB Ectopic Multiple Live Births  0 0 0 0      Past Medical History:  Diagnosis Date  . Allergy   . Anxiety   . Bipolar 1 disorder ([place])   . Depression   . Fibroadenoma of right breast   . GERD (gastroesophageal reflux disease)   . IBS (irritable bowel syndrome)      diarrhea    Past Surgical History:  Procedure Laterality Date  . LEG SURGERY Left 2014   gastreochemius surgery leg tendon snapped   . TONSILLECTOMY      Current Outpatient Medications on File Prior to Visit  Medication Sig Dispense Refill  . ARIPiprazole (ABILIFY) 10 MG tablet Take 10 mg by mouth daily.    . Cholecalciferol 1.25 MG (50000 UT) capsule Take 1 capsule (50,000 Units total) by mouth once a week. (Patient not taking: Reported on 11/10/2019) 13 capsule 1  . clonazePAM (KLONOPIN) 0.5 MG disintegrating tablet Take 0.5 mg by mouth as needed.  (Patient not taking: Reported on 11/10/2019)    . fexofenadine (ALLEGRA) 180 MG tablet Take 180 mg by mouth daily. (Patient not taking: Reported on 11/10/2019)    . metFORMIN (GLUCOPHAGE) 500 MG tablet Take 1 tablet (500 mg total) by mouth daily with breakfast. 90 tablet 3  . traZODone (DESYREL) 50 MG tablet Take 50 mg by mouth at bedtime as needed.      No current facility-administered medications on file prior to visit.    Allergies  Allergen Reactions  . Other     Social History:  reports that she has never smoked. She has never used smokeless tobacco. She reports current drug use. Drug: Marijuana. She reports that she does not drink alcohol.  Family History  Problem Relation Age of Onset  . Diabetes Maternal Grandmother   . Arthritis Maternal Grandmother   . COPD Maternal Grandmother   . Hyperlipidemia Mother   . Drug abuse Father  The following portions of the patient's history were reviewed and updated as appropriate: allergies, current medications, past family history, past medical history, past social history, past surgical history and problem list.  Review of Systems Pertinent items noted in HPI and remainder of comprehensive ROS otherwise negative.  Physical Exam:  BP 111/75   Pulse 72   Wt 157 lb 12.8 oz (71.6 kg)   LMP 11/01/2019   BMI 25.47 kg/m  CONSTITUTIONAL: Well-developed, well-nourished female in no  acute distress.  HENT:  Normocephalic, atraumatic, External right and left ear normal. Oropharynx is clear and moist EYES: Conjunctivae and EOM are normal. Pupils are equal, round, and reactive to light. No scleral icterus.  NECK: Normal range of motion, supple, no masses.  Normal thyroid.  SKIN: Skin is warm and dry. No rash noted. Not diaphoretic. No erythema. No pallor. MUSCULOSKELETAL: Normal range of motion. No tenderness.  No cyanosis, clubbing, or edema.  2+ distal pulses. NEUROLOGIC: Alert and oriented to person, place, and time. Normal reflexes, muscle tone coordination.  PSYCHIATRIC: Normal mood and affect. Normal behavior. Normal judgment and thought content. CARDIOVASCULAR: Normal heart rate noted, regular rhythm RESPIRATORY: Clear to auscultation bilaterally. Effort and breath sounds normal, no problems with respiration noted. BREASTS: Symmetric in size. No tenderness, skin changes, nipple drainage, or lymphadenopathy bilaterally. Palpable tissue at 1 o clock on right breast, 4cm from nipple consistent with previously assessed stable fibroadenoma. Performed in the presence of a chaperone. ABDOMEN: Soft, no distention noted.  No tenderness, rebound or guarding.  PELVIC: Normal appearing external genitalia and urethral meatus; normal appearing vaginal mucosa and cervix.  No abnormal discharge noted. Normal uterine size, no other palpable masses, no uterine or adnexal tenderness.  Short diagonal conjugate with notably prominent cervical os. Performed in the presence of a chaperone.   Assessment and Plan:    1. Well woman exam with routine gynecological exam - No concerning findings on physical exam - CBC - Comprehensive metabolic panel - Hemoglobin A1c - Thyroid Panel With TSH - Hep C Antibody - RPR - HIV Antibody (routine testing w rflx) - Cervicovaginal ancillary only( [place])  2. Breast fibroadenoma in female, right - Stable, location consistent with previous  surveillance  3. Dyspareunia in female - Benign physical exam including bimanual - Discussed position of uterus,anterior cervix as possible factors - Advised pelvic floor PT, pelvic ultrasound PRN. Patient returning to [place] next week, advised f/u on return to [place]. - Patient may call [facility] for referral to local PT as needed if her travel plans change   4. Bipolar 1 disorder ([place]) - Continue with current [place]-based team - Consider referral as needed to pursue patient's belief in ADHD diagnosis  5. Weight loss, unintentional - Possibly due to fluctuations in prescribed mood stabilization medications - Baseline labs pending  6. Encounter for counseling regarding contraception - Consistently negative experiences with hormonal contraception - Lifestyle and personal preferences not compatible with [place] - Defer consideration of alternatives while patient manages other stressors which are higher priority for her   Routine preventative health maintenance measures emphasized. Please refer to After Visit Summary for other counseling recommendations.     Mallie Snooks, MSN, CNM Certified Nurse Midwife, [facility] for [employer], [place] [facility] 11/10/19 11:49 AM

## 2019-11-11 ENCOUNTER — Telehealth: Payer: Self-pay

## 2019-11-11 ENCOUNTER — Other Ambulatory Visit: Payer: Self-pay | Admitting: *Deleted

## 2019-11-11 LAB — COMPREHENSIVE METABOLIC PANEL
ALT: 24 IU/L (ref 0–32)
AST: 21 IU/L (ref 0–40)
Albumin/Globulin Ratio: 1.4 (ref 1.2–2.2)
Albumin: 4.7 g/dL (ref 3.9–5.0)
Alkaline Phosphatase: 70 IU/L (ref 48–121)
BUN/Creatinine Ratio: 5 — ABNORMAL LOW (ref 9–23)
BUN: 4 mg/dL — ABNORMAL LOW (ref 6–20)
Bilirubin Total: 0.6 mg/dL (ref 0.0–1.2)
CO2: 26 mmol/L (ref 20–29)
Calcium: 10.2 mg/dL (ref 8.7–10.2)
Chloride: 102 mmol/L (ref 96–106)
Creatinine, Ser: 0.76 mg/dL (ref 0.57–1.00)
GFR calc Af Amer: 126 mL/min/{1.73_m2} (ref >59)
GFR calc non Af Amer: 109 mL/min/{1.73_m2} (ref >59)
Globulin, Total: 3.3 g/dL (ref 1.5–4.5)
Glucose: 97 mg/dL (ref 65–99)
Potassium: 3.7 mmol/L (ref 3.5–5.2)
Sodium: 141 mmol/L (ref 134–144)
Total Protein: 8 g/dL (ref 6.0–8.5)

## 2019-11-11 LAB — THYROID PANEL WITH TSH
Free Thyroxine Index: 2 (ref 1.2–4.9)
T3 Uptake Ratio: 30 % (ref 24–39)
T4, Total: 6.6 ug/dL (ref 4.5–12.0)
TSH: 2.79 u[IU]/mL (ref 0.450–4.500)

## 2019-11-11 LAB — CERVICOVAGINAL ANCILLARY ONLY
Bacterial Vaginitis (gardnerella): POSITIVE — AB
Candida Glabrata: NEGATIVE
Candida Vaginitis: NEGATIVE
Chlamydia: NEGATIVE
Comment: NEGATIVE
Comment: NEGATIVE
Comment: NEGATIVE
Comment: NEGATIVE
Comment: NEGATIVE
Comment: NORMAL
Neisseria Gonorrhea: NEGATIVE
Trichomonas: NEGATIVE

## 2019-11-11 LAB — HEMOGLOBIN A1C
Est. average glucose Bld gHb Est-mCnc: 88 mg/dL
Hgb A1c MFr Bld: 4.7 % — ABNORMAL LOW (ref 4.8–5.6)

## 2019-11-11 LAB — CBC
Hematocrit: 40.5 % (ref 34.0–46.6)
Hemoglobin: 13.5 g/dL (ref 11.1–15.9)
MCH: 32.2 pg (ref 26.6–33.0)
MCHC: 33.3 g/dL (ref 31.5–35.7)
MCV: 97 fL (ref 79–97)
Platelets: 283 10*3/uL (ref 150–450)
RBC: 4.19 x10E6/uL (ref 3.77–5.28)
RDW: 13 % (ref 11.7–15.4)
WBC: 4.6 10*3/uL (ref 3.4–10.8)

## 2019-11-11 LAB — RPR: RPR Ser Ql: NONREACTIVE

## 2019-11-11 LAB — HIV ANTIBODY (ROUTINE TESTING W REFLEX): HIV Screen 4th Generation wRfx: NONREACTIVE

## 2019-11-11 LAB — HEPATITIS C ANTIBODY: Hep C Virus Ab: <0.1 s/co ratio (ref 0.0–0.9)

## 2019-11-11 MED ORDER — METRONIDAZOLE 500 MG PO TABS: 500.0000 mg | ORAL_TABLET | Freq: Two times a day (BID) | ORAL | 0 refills | Status: AC

## 2019-11-11 MED FILL — METRONIDAZOLE 500 MG TABS: 500 | 7 days supply | Qty: 14 | Fill #0

## 2019-11-11 NOTE — Telephone Encounter (Signed)
Referral was placed for patient to see PT pelvic floor. Patient reported to the front staff she is moving back to [place] next week. [place] staff has fax over supporting documents for the referral as requested. Patient can either follow up in [place] or wait until she returns back [place] to schedule an appointment.

## 2019-11-11 NOTE — Addendum Note (Signed)
Addended by: Phillip Heal, Jelissa Espiritu A on: 11/11/2019 10:59 AM   Modules accepted: Orders

## 2020-01-14 DIAGNOSIS — J029 Acute pharyngitis, unspecified: Secondary | ICD-10-CM | POA: Diagnosis not present

## 2020-01-14 DIAGNOSIS — N760 Acute vaginitis: Secondary | ICD-10-CM | POA: Diagnosis not present

## 2020-01-14 DIAGNOSIS — E663 Overweight: Secondary | ICD-10-CM | POA: Diagnosis not present

## 2020-01-14 DIAGNOSIS — Z6826 Body mass index (BMI) 26.0-26.9, adult: Secondary | ICD-10-CM | POA: Diagnosis not present

## 2020-02-02 IMAGING — US US BREAST*R* LIMITED INC AXILLA
1 series · 5 of 5 positions shown · non-contrast
Comparison: Previous exam(s).

CLINICAL DATA: 23-year-old female for slightly delayed 2 year
follow-up of RIGHT breast mass.

EXAM:
ULTRASOUND OF THE RIGHT BREAST

[Series 1: us breast*right* limited inc axilla · 0.06mm/px · 5 of 5 slices shown]
[im 1/5]
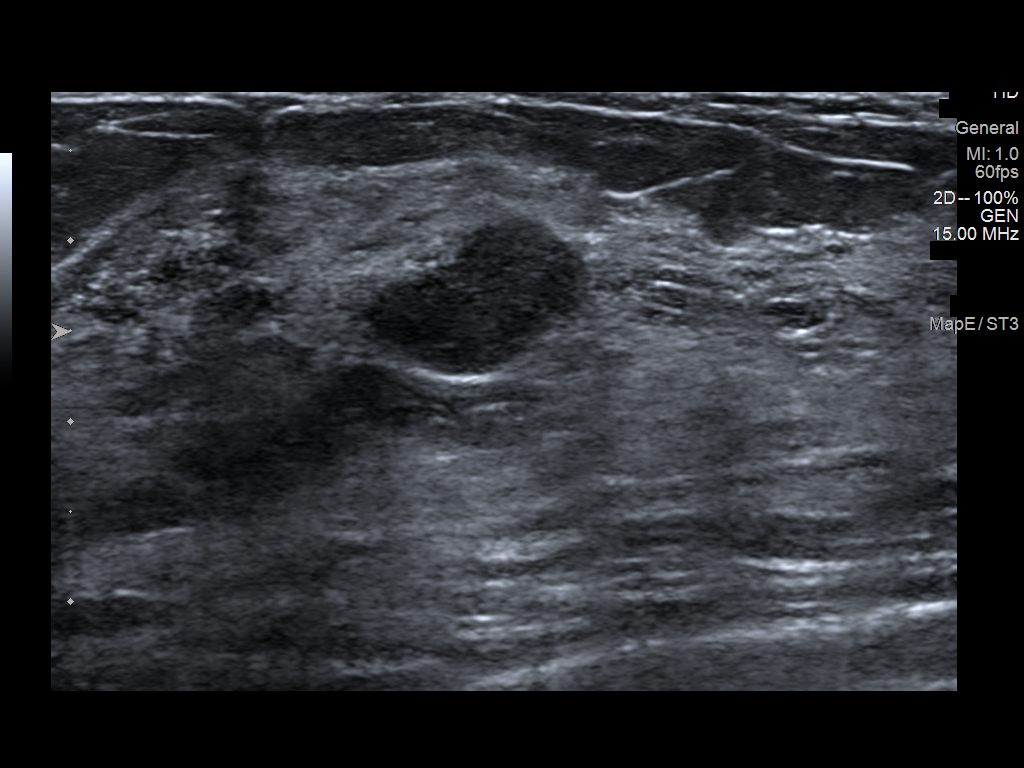
[im 2/5]
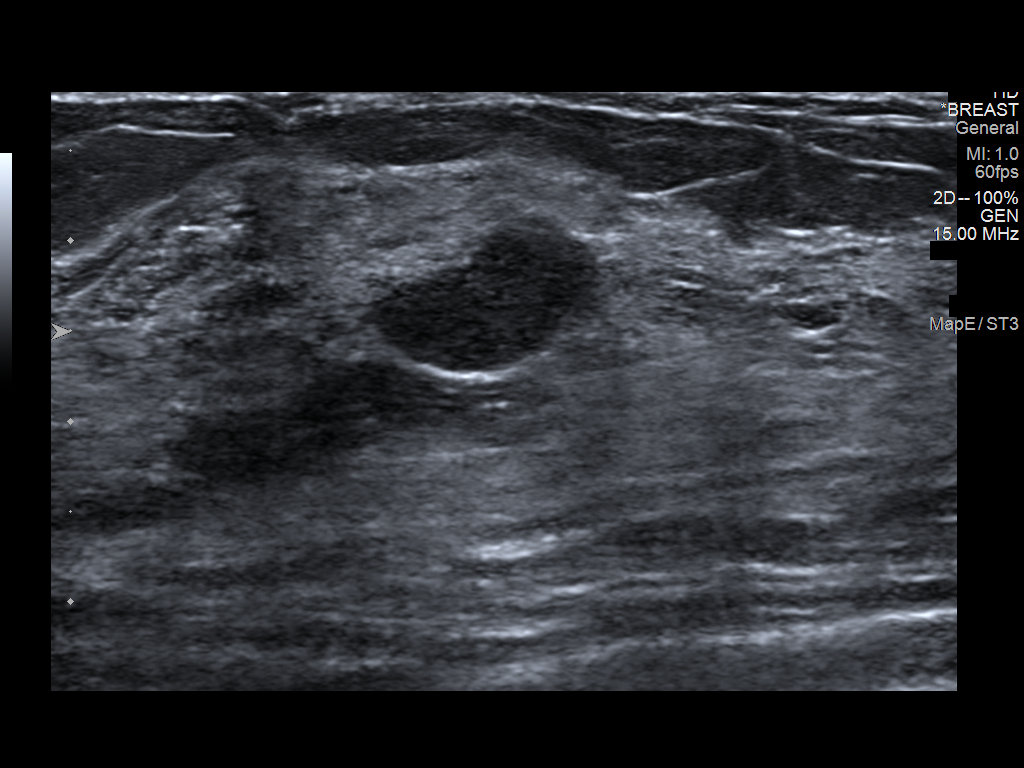
[im 3/5]
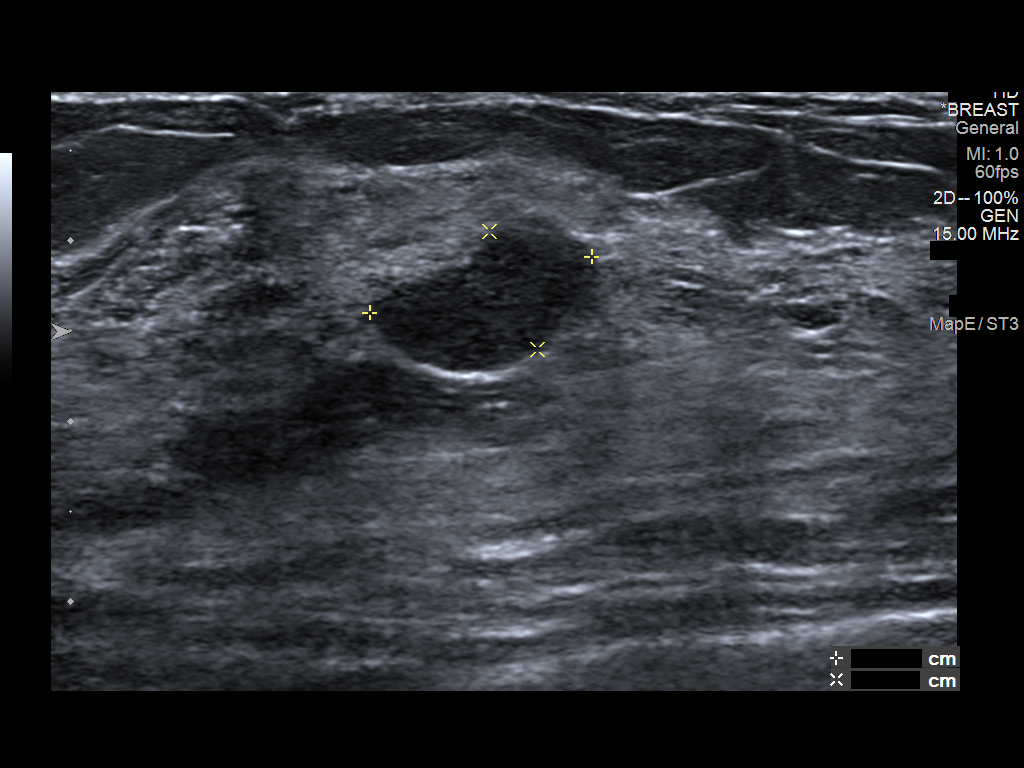
[im 4/5]
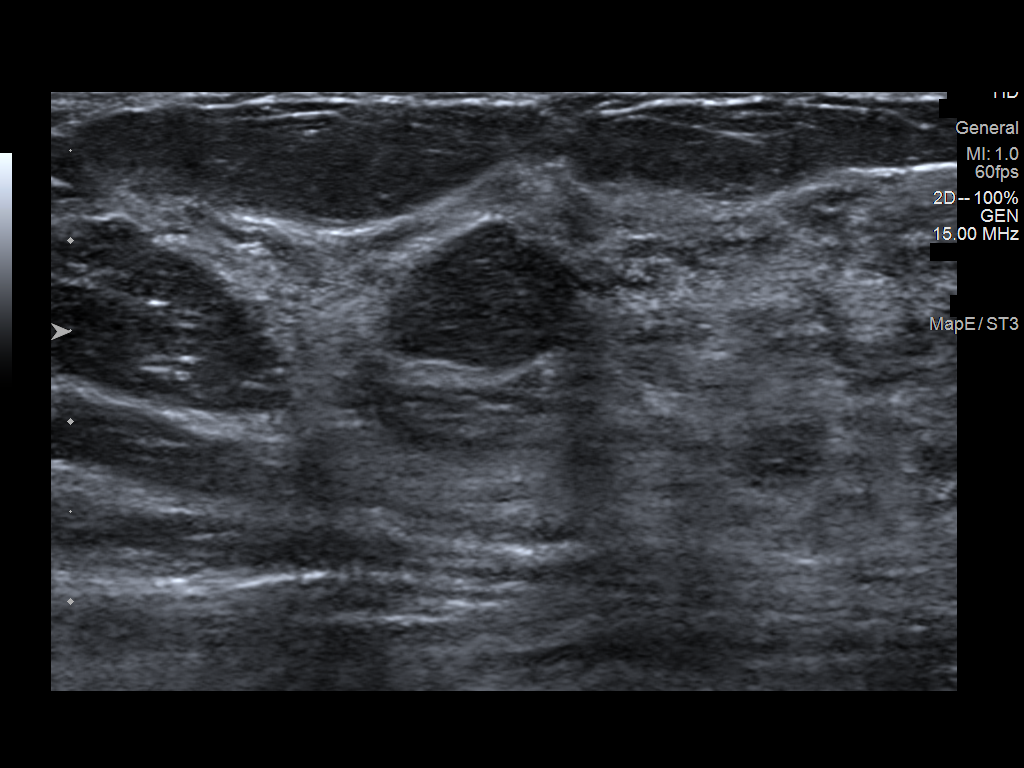
[im 5/5]
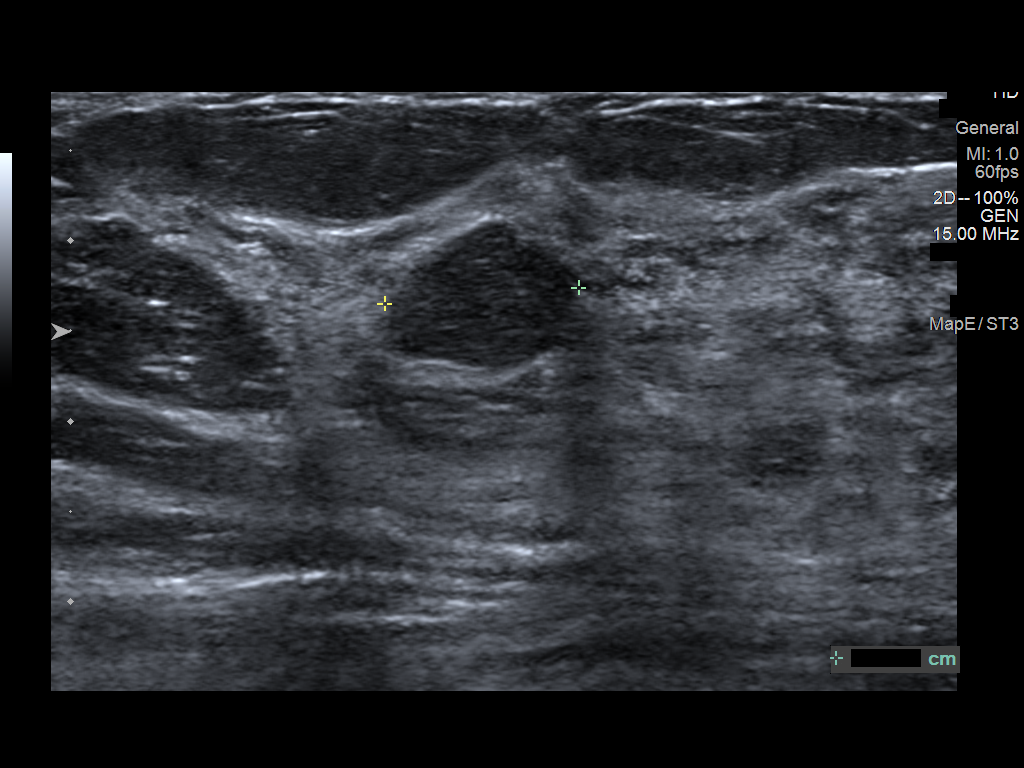

[5 of 5 positions shown; findings below may reference images not displayed]

FINDINGS: Targeted ultrasound is performed, showing a stable 1.3 x 0.7 x
cm circumscribed oval hypoechoic parallel mass at the [DATE] position
of the RIGHT breast 4 cm from the nipple, compatible with a
fibroadenoma.
IMPRESSION: Stable UPPER RIGHT breast mass, greater than 2 years, compatible
with a fibroadenoma.

RECOMMENDATION:
Bilateral screening mammogram at age 39.

I have discussed the findings and recommendations with the patient.
If applicable, a reminder letter will be sent to the patient
regarding the next appointment.

BI-RADS CATEGORY  2: Benign.

## 2020-02-28 DIAGNOSIS — L810 Postinflammatory hyperpigmentation: Secondary | ICD-10-CM | POA: Diagnosis not present

## 2020-02-28 DIAGNOSIS — L700 Acne vulgaris: Secondary | ICD-10-CM | POA: Diagnosis not present

## 2020-03-01 DIAGNOSIS — L299 Pruritus, unspecified: Secondary | ICD-10-CM | POA: Diagnosis not present

## 2020-03-01 DIAGNOSIS — L700 Acne vulgaris: Secondary | ICD-10-CM | POA: Diagnosis not present

## 2020-03-01 DIAGNOSIS — D649 Anemia, unspecified: Secondary | ICD-10-CM | POA: Diagnosis not present

## 2020-03-01 DIAGNOSIS — Z560 Unemployment, unspecified: Secondary | ICD-10-CM | POA: Diagnosis not present

## 2020-03-01 DIAGNOSIS — E559 Vitamin D deficiency, unspecified: Secondary | ICD-10-CM | POA: Diagnosis not present

## 2020-03-01 DIAGNOSIS — L659 Nonscarring hair loss, unspecified: Secondary | ICD-10-CM | POA: Diagnosis not present

## 2020-03-01 DIAGNOSIS — Z79899 Other long term (current) drug therapy: Secondary | ICD-10-CM | POA: Diagnosis not present

## 2020-03-01 DIAGNOSIS — L853 Xerosis cutis: Secondary | ICD-10-CM | POA: Diagnosis not present

## 2020-03-01 DIAGNOSIS — E079 Disorder of thyroid, unspecified: Secondary | ICD-10-CM | POA: Diagnosis not present

## 2020-05-29 DIAGNOSIS — L219 Seborrheic dermatitis, unspecified: Secondary | ICD-10-CM | POA: Diagnosis not present

## 2020-05-29 DIAGNOSIS — L659 Nonscarring hair loss, unspecified: Secondary | ICD-10-CM | POA: Diagnosis not present

## 2020-05-29 DIAGNOSIS — Z79899 Other long term (current) drug therapy: Secondary | ICD-10-CM | POA: Diagnosis not present

## 2020-05-29 DIAGNOSIS — L700 Acne vulgaris: Secondary | ICD-10-CM | POA: Diagnosis not present

## 2020-09-26 ENCOUNTER — Encounter: Payer: Self-pay | Admitting: Radiology

## 2020-10-05 ENCOUNTER — Telehealth: Payer: Self-pay

## 2020-10-05 NOTE — Telephone Encounter (Signed)
Pt called and requested to speak with Maryelizabeth Kaufmann, CNM about pelvic floor referral in [place], [place].  Message routed to [facility] clinical pool as she has been seen in there office.    Frances Nickels  10/05/20
